# Patient Record
Sex: Male | Born: 1970 | Race: Black or African American | Hispanic: No | Marital: Single | Smoking: Never smoker
Health system: Southern US, Community
[De-identification: ages and names within clinical notes are randomized; demographics above are authoritative.]

## PROBLEM LIST (undated history)

## (undated) DIAGNOSIS — E119 Type 2 diabetes mellitus without complications: Secondary | ICD-10-CM

## (undated) DIAGNOSIS — S86011A Strain of right Achilles tendon, initial encounter: Secondary | ICD-10-CM

## (undated) HISTORY — PX: OTHER SURGICAL HISTORY: SHX169

## (undated) HISTORY — PX: NO PAST SURGERIES: SHX2092

---

## 2011-08-25 ENCOUNTER — Ambulatory Visit
Admission: RE | Admit: 2011-08-25 | Discharge: 2011-08-25 | Disposition: A | Discharge status: Home / Self Care | Payer: PRIVATE HEALTH INSURANCE | Source: Ambulatory Visit | Attending: Family Medicine | Admitting: Family Medicine

## 2011-08-25 ENCOUNTER — Other Ambulatory Visit: Payer: Self-pay | Admitting: Family Medicine

## 2011-08-25 ENCOUNTER — Ambulatory Visit: Payer: PRIVATE HEALTH INSURANCE

## 2011-08-25 ENCOUNTER — Ambulatory Visit (INDEPENDENT_AMBULATORY_CARE_PROVIDER_SITE_OTHER): Payer: PRIVATE HEALTH INSURANCE | Admitting: Family Medicine

## 2011-08-25 VITALS — BP 129/78 | HR 61 | Temp 98.5°F | Resp 16 | Ht 62.25 in | Wt 174.4 lb

## 2011-08-25 DIAGNOSIS — R1011 Right upper quadrant pain: Secondary | ICD-10-CM

## 2011-08-25 DIAGNOSIS — R079 Chest pain, unspecified: Secondary | ICD-10-CM

## 2011-08-25 LAB — POCT CBC
Granulocyte percent: 66.2 %G (ref 37–80)
MCV: 91.4 fL (ref 80–97)
MID (cbc): 0.8 (ref 0–0.9)
MPV: 8.3 fL (ref 0–99.8)
POC Granulocyte: 8.8 — AB (ref 2–6.9)
Platelet Count, POC: 277 10*3/uL (ref 142–424)
RBC: 4.96 M/uL (ref 4.69–6.13)
RDW, POC: 12.4 %

## 2011-08-25 LAB — POCT URINALYSIS DIPSTICK
Blood, UA: NEGATIVE
Nitrite, UA: NEGATIVE
Spec Grav, UA: 1.01
Urobilinogen, UA: 0.2
pH, UA: 5.5

## 2011-08-25 LAB — COMPREHENSIVE METABOLIC PANEL
ALT: 43 U/L (ref 0–53)
AST: 26 U/L (ref 0–37)
Albumin: 4.3 g/dL (ref 3.5–5.2)
Alkaline Phosphatase: 76 U/L (ref 39–117)
BUN: 13 mg/dL (ref 6–23)
Calcium: 9.6 mg/dL (ref 8.4–10.5)
Chloride: 100 mEq/L (ref 96–112)
Potassium: 4.2 mEq/L (ref 3.5–5.3)
Sodium: 138 mEq/L (ref 135–145)
Total Protein: 7.7 g/dL (ref 6.0–8.3)

## 2011-08-25 LAB — POCT UA - MICROSCOPIC ONLY
Casts, Ur, LPF, POC: NEGATIVE
Crystals, Ur, HPF, POC: NEGATIVE

## 2011-08-25 MED ORDER — CYCLOBENZAPRINE HCL 10 MG PO TABS: 10.0000 mg | ORAL_TABLET | Freq: Two times a day (BID) | ORAL | Status: AC | PRN

## 2011-08-25 NOTE — Progress Notes (Signed)
Patient Name: Derek Hopkins Date of Birth: [birth year] Medical Record Number: [id number] Gender: male Date of Encounter: 08/25/2011  History of Present Illness:  Derek Hopkins is a 41 y.o. very pleasant male patient who presents with the following:  Notes a discomfort/ "pressure" in his RUQ for the last 3 days.  He had an episode of similar symptoms which resolved- this occurred several years ago- otherwise has not had this before.   No nausea, no vomiting.  Has felt "a little weak" at times. No diarrhea or constipation- he has been having stools normally- had a BM today.    He has been eating and has not noted a change in his symptoms with eating.  No coughing- he does have AR symptoms but they are mild No SOB.  No known injury Lying down is the most comfortable position for him- sitting up is uncomfortable.    Otherwise he is generally a healthy person  There is no problem list on file for this patient.  No past medical history on file. No past surgical history on file. History  Substance Use Topics  . Smoking status: Never Smoker   . Smokeless tobacco: Not on file  . Alcohol Use: Not on file   No family history on file. No Known Allergies  Medication list has been reviewed and updated.  Review of Systems: As per HPI- otherwise negative.   Physical Examination: Filed Vitals:   08/25/11 1238  BP: 129/78  Pulse: 61  Temp: 98.5 F (36.9 C)  TempSrc: Oral  Resp: 16  Height: 5' 2.25" (1.581 m)  Weight: 174 lb 6.4 oz (79.107 kg)  SpO2: 97%    Body mass index is 31.64 kg/(m^2).  GEN: WDWN, NAD, Non-toxic, A & O x 3, overweight HEENT: Atraumatic, Normocephalic. Neck supple. No masses, No LAD.  Tm, oropharynx wnl Ears and Nose: No external deformity. CV: RRR, No M/G/R. No JVD. No thrill. No extra heart sounds. PULM: CTA B, no wheezes, crackles, rhonchi. No retractions. No resp. distress. No accessory muscle use. ABD: S, NT, ND, +BS. No rebound. No HSM. EXTR: No  c/c/e NEURO Normal gait.  PSYCH: Normally interactive. Conversant. Not depressed or anxious appearing.  Calm demeanor.  There is some mild discomfort to pressure on the lower right ribs. No rash, redness, bruise or lesion.  Negative murphy's  [facility] reading (PRIMARY) by  Dr. Patsy Lager.  Unremarkable chest and abdomen 1 view each Added lateral chest: also negative  Results for orders placed in visit on 08/25/11  POCT CBC      Component Value Range   WBC 13.3 (*) 4.6 - 10.2 (K/uL)   Lymph, poc 3.7 (*) 0.6 - 3.4    POC LYMPH PERCENT 27.5  10 - 50 (%L)   MID (cbc) 0.8  0 - 0.9    POC MID % 6.3  0 - 12 (%M)   POC Granulocyte 8.8 (*) 2 - 6.9    Granulocyte percent 66.2  37 - 80 (%G)   RBC 4.96  4.69 - 6.13 (M/uL)   Hemoglobin 15.2  14.1 - 18.1 (g/dL)   HCT, POC 40.9  81.1 - 53.7 (%)   MCV 91.4  80 - 97 (fL)   MCH, POC 30.6  27 - 31.2 (pg)   MCHC 33.6  31.8 - 35.4 (g/dL)   RDW, POC 91.4     Platelet Count, POC 277  142 - 424 (K/uL)   MPV 8.3  0 - 99.8 (fL)  POCT URINALYSIS DIPSTICK  Component Value Range   Color, UA yellow     Clarity, UA clear     Glucose, UA neg     Bilirubin, UA neg     Ketones, UA neg     Spec Grav, UA 1.010     Blood, UA neg     pH, UA 5.5     Protein, UA neg     Urobilinogen, UA 0.2     Nitrite, UA neg     Leukocytes, UA Negative    POCT UA - MICROSCOPIC ONLY      Component Value Range   WBC, Ur, HPF, POC 0-1     RBC, urine, microscopic 0-1     Bacteria, U Microscopic neg     Mucus, UA trace     Epithelial cells, urine per micros 0-1     Crystals, Ur, HPF, POC neg     Casts, Ur, LPF, POC neg     Yeast, UA neg     He last ate a fiber bar around 8:30am, and has been drinking water.  Will send for abdominal U/S to rule- out cholecystitis.   ABDOMEN - 1 VIEW  Comparison: Chest radiograph today.  Findings: Normal bowel gas pattern. Calcified phleboliths in the anatomic pelvis. Mild right hip osteoarthritis. No  bowel dilation.  IMPRESSION: Nonobstructive bowel gas pattern. Mild right hip osteoarthritis.  CHEST - 2 VIEW  Comparison: None.  Findings: Cardiopericardial silhouette within normal limits. Mediastinal contours normal. Trachea midline. No airspace disease or effusion. No free air is present under the hemidiaphragms. Slightly low volumes on the frontal.  IMPRESSION: Negative two-view chest.  Assessment and Plan: 1. RUQ pain  POCT CBC, Comprehensive metabolic panel, POCT urinalysis dipstick, POCT UA - Microscopic Only, DG Chest 1 View, DG Abd 1 View, US Abdomen Complete, cyclobenzaprine (FLEXERIL) 10 MG tablet   Once ultrasound results in called Jillyn Hidden- no gallbladder disease.  Suspect that he is having MSK pain.  Await CMP. Trial of flexeril as above.  If he is not feeling better plan to recheck in 1 or 2 days.  If he is getting worse please call, RTC or go to ED.  He agreed with the plan and felt comfortable with follow- up. We also need to recheck his CBC in a few days regardless- will put this order on chart so he can do a lab visit only IF he does not otherwise need to follow- up.   COMPLETE ABDOMINAL ULTRASOUND  Comparison: None  Findings:  Gallbladder: No gallstones, gallbladder wall thickening, or pericholecystic fluid.  Common bile duct: Normal in caliber measuring a maximum of 2.30mm.  Liver: The liver is sonographically unremarkable. There is normal echogenicity without focal lesions or intrahepatic biliary dilatation.  IVC: Normal caliber.  Pancreas: Limited visualization due to overlying bowel gas. The body region of the pancreas that is visualized appears normal.  Spleen: Normal size and echogenicity without focal lesions.  Right Kidney: 8.5 cm in length. Normal renal cortical thickness and echogenicity without focal lesions or hydronephrosis.  Left Kidney: 10.5 cm in length. Normal renal cortical thickness and echogenicity without focal lesions or  hydronephrosis.  Abdominal aorta: Normal caliber.  IMPRESSION: Unremarkable abdominal ultrasound examination. Limited examination of the pancreas.

## 2011-08-27 ENCOUNTER — Ambulatory Visit: Payer: PRIVATE HEALTH INSURANCE

## 2011-08-27 ENCOUNTER — Telehealth: Payer: Self-pay | Admitting: Radiology

## 2011-08-27 NOTE — Telephone Encounter (Signed)
Called and LMOM- I will see him tomorrow, call in the meantime if he needs anything

## 2011-08-27 NOTE — Telephone Encounter (Signed)
Patient did come in after you left today. He will return tomorrow to see you only. He was not seen today. He would like a phone call from you though about his labs and medication. I did tell him that you would probably want to see him anyway even if you called him.

## 2011-08-28 ENCOUNTER — Ambulatory Visit (INDEPENDENT_AMBULATORY_CARE_PROVIDER_SITE_OTHER): Payer: PRIVATE HEALTH INSURANCE | Admitting: Family Medicine

## 2011-08-28 VITALS — BP 123/82 | HR 59 | Temp 97.9°F | Resp 16 | Ht 62.25 in | Wt 174.0 lb

## 2011-08-28 DIAGNOSIS — R1011 Right upper quadrant pain: Secondary | ICD-10-CM

## 2011-08-28 DIAGNOSIS — R109 Unspecified abdominal pain: Secondary | ICD-10-CM

## 2011-08-28 DIAGNOSIS — D72829 Elevated white blood cell count, unspecified: Secondary | ICD-10-CM

## 2011-08-28 LAB — POCT CBC
Hemoglobin: 15 g/dL (ref 14.1–18.1)
MCH, POC: 30.2 pg (ref 27–31.2)
MCHC: 32.8 g/dL (ref 31.8–35.4)
MID (cbc): 0.7 (ref 0–0.9)
MPV: 8.2 fL (ref 0–99.8)
POC Granulocyte: 6.9 (ref 2–6.9)
POC MID %: 5.9 %M (ref 0–12)
Platelet Count, POC: 308 10*3/uL (ref 142–424)
RBC: 4.96 M/uL (ref 4.69–6.13)
WBC: 11.3 10*3/uL — AB (ref 4.6–10.2)

## 2011-08-28 NOTE — Progress Notes (Signed)
Patient Name: Derek Hopkins Date of Birth: 01/16/1971 Medical Record Number: [id number] Gender: male Date of Encounter: 08/28/2011  History of Present Illness:  Derek Hopkins is a 41 y.o. very pleasant male patient who presents with the following:  Here today to recheck his right UQ/ right lower rib discomfort- see last OV.  He had a negative abdominal ultrasound and CMP- but he did have a leukocytosis.    He does "not feel 100%" but does note improvement. He will occasionally feel a pain but changing position usually resolves it. Breathing ok- no SOB, no cough, no fever.  He did sweat a lot over the weekend.  Eating ok- no nausea or vomiting.    There is no problem list on file for this patient.  No past medical history on file. No past surgical history on file. History  Substance Use Topics  . Smoking status: Never Smoker   . Smokeless tobacco: Not on file  . Alcohol Use: Not on file   No family history on file. No Known Allergies  Medication list has been reviewed and updated.  Review of Systems: As per HPI- otherwise negative.   Physical Examination: Filed Vitals:   08/28/11 1515  BP: 123/82  Pulse: 59  Temp: 97.9 F (36.6 C)  TempSrc: Oral  Resp: 16  Height: 5' 2.25" (1.581 m)  Weight: 174 lb (78.926 kg)    Body mass index is 31.57 kg/(m^2).  GEN: WDWN, NAD, Non-toxic, A & O x 3, overweight HEENT: Atraumatic, Normocephalic. Neck supple. No masses, No LAD. Ears and Nose: No external deformity. CV: RRR, No M/G/R. No JVD. No thrill. No extra heart sounds. PULM: CTA B, no wheezes, crackles, rhonchi. No retractions. No resp. distress. No accessory muscle use. ABD: S, NT, ND, +BS. No rebound. No HSM.  He no longer complains of tenderness to exam in his RUQ/ lower right rib border.  Negative murphys  EXTR: No c/c/e NEURO Normal gait.  PSYCH: Normally interactive. Conversant. Not depressed or anxious appearing.  Calm demeanor.   Results for orders placed in visit on  08/28/11  POCT CBC      Component Value Range   WBC 11.3 (*) 4.6 - 10.2 (K/uL)   Lymph, poc 3.7 (*) 0.6 - 3.4    POC LYMPH PERCENT 33.1  10 - 50 (%L)   MID (cbc) 0.7  0 - 0.9    POC MID % 5.9  0 - 12 (%M)   POC Granulocyte 6.9  2 - 6.9    Granulocyte percent 61.0  37 - 80 (%G)   RBC 4.96  4.69 - 6.13 (M/uL)   Hemoglobin 15.0  14.1 - 18.1 (g/dL)   HCT, POC 78.2  95.6 - 53.7 (%)   MCV 92.3  80 - 97 (fL)   MCH, POC 30.2  27 - 31.2 (pg)   MCHC 32.8  31.8 - 35.4 (g/dL)   RDW, POC 21.3     Platelet Count, POC 308  142 - 424 (K/uL)   MPV 8.2  0 - 99.8 (fL)     Assessment and Plan: 1. Abdominal  pain, other specified site    2. Leukocytosis  POCT CBC   Leukocytosis is improving, as are his symptoms.  Fairly extensive w/u including xrays, abdominal U/S and CMP all negative. As long as he continues to improve to 100% well he does not need to have a recheck- however I did ask him to have a lab visit only in about 3 weeks to  ensure his WBC count is back to normal- he has an order for this.    If his symptoms get worse or do not continue to improve he will RTC.

## 2011-12-16 ENCOUNTER — Ambulatory Visit (INDEPENDENT_AMBULATORY_CARE_PROVIDER_SITE_OTHER): Payer: PRIVATE HEALTH INSURANCE | Admitting: Emergency Medicine

## 2011-12-16 VITALS — BP 128/75 | HR 54 | Temp 98.5°F | Resp 16 | Ht 62.25 in | Wt 179.0 lb

## 2011-12-16 DIAGNOSIS — H6120 Impacted cerumen, unspecified ear: Secondary | ICD-10-CM

## 2011-12-16 DIAGNOSIS — H6690 Otitis media, unspecified, unspecified ear: Secondary | ICD-10-CM

## 2011-12-16 DIAGNOSIS — H66009 Acute suppurative otitis media without spontaneous rupture of ear drum, unspecified ear: Secondary | ICD-10-CM

## 2011-12-16 MED ORDER — FLUTICASONE PROPIONATE 50 MCG/ACT NA SUSP: 2.0000 | Freq: Every day | NASAL | Status: DC

## 2011-12-16 MED ORDER — AMOXICILLIN 875 MG PO TABS: 875.0000 mg | ORAL_TABLET | Freq: Two times a day (BID) | ORAL | Status: AC

## 2011-12-16 NOTE — Progress Notes (Signed)
  Subjective:    Patient ID: Derek Hopkins, male    DOB: [birth year], 41 y.o.   MRN: [id number]  HPI and a history of head congestion. Possibility here of his left year ago he has not had much pain.    Review of Systems     Objective:   Physical Exam the left external auditory canal is filled with wax. The right TM is normal the nose is slightly congested. Throat is clear neck supple chest is clear to both auscultation and percussion.        Assessment & Plan:

## 2012-08-05 ENCOUNTER — Ambulatory Visit: Payer: PRIVATE HEALTH INSURANCE

## 2012-08-05 ENCOUNTER — Ambulatory Visit (INDEPENDENT_AMBULATORY_CARE_PROVIDER_SITE_OTHER): Payer: PRIVATE HEALTH INSURANCE | Admitting: Family Medicine

## 2012-08-05 VITALS — BP 140/82 | HR 70 | Temp 98.0°F | Resp 16 | Ht 62.5 in | Wt 178.0 lb

## 2012-08-05 DIAGNOSIS — R1011 Right upper quadrant pain: Secondary | ICD-10-CM

## 2012-08-05 DIAGNOSIS — K5900 Constipation, unspecified: Secondary | ICD-10-CM

## 2012-08-05 DIAGNOSIS — R1084 Generalized abdominal pain: Secondary | ICD-10-CM

## 2012-08-05 LAB — POCT CBC
Granulocyte percent: 61.3 %G (ref 37–80)
HCT, POC: 45.9 % (ref 43.5–53.7)
Hemoglobin: 15 g/dL (ref 14.1–18.1)
Lymph, poc: 3.2 (ref 0.6–3.4)
MCH, POC: 30.7 pg (ref 27–31.2)
MCHC: 32.7 g/dL (ref 31.8–35.4)
MCV: 93.9 fL (ref 80–97)
MID (cbc): 1 — AB (ref 0–0.9)
MPV: 8.7 fL (ref 0–99.8)
POC Granulocyte: 6.6 (ref 2–6.9)
POC LYMPH PERCENT: 29.5 %L (ref 10–50)
POC MID %: 9.2 % (ref 0–12)
Platelet Count, POC: 262 10*3/uL (ref 142–424)
RBC: 4.89 M/uL (ref 4.69–6.13)
RDW, POC: 13 %
WBC: 10.7 10*3/uL — AB (ref 4.6–10.2)

## 2012-08-05 LAB — COMPREHENSIVE METABOLIC PANEL WITH GFR
ALT: 25 U/L (ref 0–53)
AST: 16 U/L (ref 0–37)
Alkaline Phosphatase: 82 U/L (ref 39–117)
CO2: 25 meq/L (ref 19–32)
Sodium: 138 meq/L (ref 135–145)
Total Bilirubin: 0.4 mg/dL (ref 0.3–1.2)
Total Protein: 8 g/dL (ref 6.0–8.3)

## 2012-08-05 LAB — POCT URINALYSIS DIPSTICK
Bilirubin, UA: NEGATIVE
Blood, UA: NEGATIVE
Glucose, UA: NEGATIVE
Ketones, UA: NEGATIVE
Leukocytes, UA: NEGATIVE
Nitrite, UA: NEGATIVE
Protein, UA: NEGATIVE
Spec Grav, UA: 1.025
Urobilinogen, UA: 0.2
pH, UA: 5

## 2012-08-05 LAB — COMPREHENSIVE METABOLIC PANEL
Albumin: 4.3 g/dL (ref 3.5–5.2)
BUN: 13 mg/dL (ref 6–23)
Calcium: 9.8 mg/dL (ref 8.4–10.5)
Chloride: 103 mEq/L (ref 96–112)
Creat: 1.22 mg/dL (ref 0.50–1.35)
Glucose, Bld: 76 mg/dL (ref 70–99)
Potassium: 4.1 mEq/L (ref 3.5–5.3)

## 2012-08-05 LAB — POCT UA - MICROSCOPIC ONLY
Bacteria, U Microscopic: NEGATIVE
Casts, Ur, LPF, POC: NEGATIVE
Crystals, Ur, HPF, POC: NEGATIVE
Epithelial cells, urine per micros: NEGATIVE
Mucus, UA: NEGATIVE
Yeast, UA: NEGATIVE

## 2012-08-05 LAB — AMYLASE: Amylase: 80 U/L (ref 0–105)

## 2012-08-05 LAB — LIPASE: Lipase: 16 U/L (ref 0–75)

## 2012-08-05 NOTE — Patient Instructions (Signed)
High-Fiber Diet Fiber is found in fruits, vegetables, and grains. A high-fiber diet encourages the addition of more whole grains, legumes, fruits, and vegetables in your diet. The recommended amount of fiber for adult males is 38 g per day. For adult females, it is 25 g per day. Pregnant and lactating women should get 28 g of fiber per day. If you have a digestive or bowel problem, ask your caregiver for advice before adding high-fiber foods to your diet. Eat a variety of high-fiber foods instead of only a select few type of foods.  PURPOSE  To increase stool bulk.  To make bowel movements more regular to prevent constipation.  To lower cholesterol.  To prevent overeating. WHEN IS THIS DIET USED?  It may be used if you have constipation and hemorrhoids.  It may be used if you have uncomplicated diverticulosis (intestine condition) and irritable bowel syndrome.  It may be used if you need help with weight management.  It may be used if you want to add it to your diet as a protective measure against atherosclerosis, diabetes, and cancer. SOURCES OF FIBER  Whole-grain breads and cereals.  Fruits, such as apples, oranges, bananas, berries, prunes, and pears.  Vegetables, such as green peas, carrots, sweet potatoes, beets, broccoli, cabbage, spinach, and artichokes.  Legumes, such split peas, soy, lentils.  Almonds. FIBER CONTENT IN FOODS Starches and Grains / Dietary Fiber (g)  Cheerios, 1 cup / 3 g  Corn Flakes cereal, 1 cup / 0.7 g  Rice crispy treat cereal, 1 cup / 0.3 g  Instant oatmeal (cooked),  cup / 2 g  Frosted wheat cereal, 1 cup / 5.1 g  Brown, long-grain rice (cooked), 1 cup / 3.5 g  White, long-grain rice (cooked), 1 cup / 0.6 g  Enriched macaroni (cooked), 1 cup / 2.5 g Legumes / Dietary Fiber (g)  Baked beans (canned, plain, or vegetarian),  cup / 5.2 g  Kidney beans (canned),  cup / 6.8 g  Pinto beans (cooked),  cup / 5.5 g Breads and Crackers  / Dietary Fiber (g)  Plain or honey graham crackers, 2 squares / 0.7 g  Saltine crackers, 3 squares / 0.3 g  Plain, salted pretzels, 10 pieces / 1.8 g  Whole-wheat bread, 1 slice / 1.9 g  White bread, 1 slice / 0.7 g  Raisin bread, 1 slice / 1.2 g  Plain bagel, 3 oz / 2 g  Flour tortilla, 1 oz / 0.9 g  Corn tortilla, 1 small / 1.5 g  Hamburger or hotdog bun, 1 small / 0.9 g Fruits / Dietary Fiber (g)  Apple with skin, 1 medium / 4.4 g  Sweetened applesauce,  cup / 1.5 g  Banana,  medium / 1.5 g  Grapes, 10 grapes / 0.4 g  Orange, 1 small / 2.3 g  Raisin, 1.5 oz / 1.6 g  Melon, 1 cup / 1.4 g Vegetables / Dietary Fiber (g)  Green beans (canned),  cup / 1.3 g  Carrots (cooked),  cup / 2.3 g  Broccoli (cooked),  cup / 2.8 g  Peas (cooked),  cup / 4.4 g  Mashed potatoes,  cup / 1.6 g  Lettuce, 1 cup / 0.5 g  Corn (canned),  cup / 1.6 g  Tomato,  cup / 1.1 g Document Released: 03/27/2005 Document Revised: 09/26/2011 Document Reviewed: 06/29/2011 [facility] Patient Information 2013 [place], [place]. Constipation, Adult Constipation is when a person has fewer than 3 bowel movements a week; has  difficulty having a bowel movement; or has stools that are dry, hard, or larger than normal. As people grow older, constipation is more common. If you try to fix constipation with medicines that make you have a bowel movement (laxatives), the problem may get worse. Long-term laxative use may cause the muscles of the colon to become weak. A low-fiber diet, not taking in enough fluids, and taking certain medicines may make constipation worse. CAUSES   Certain medicines, such as antidepressants, pain medicine, iron supplements, antacids, and water pills.   Certain diseases, such as diabetes, irritable bowel syndrome (IBS), thyroid disease, or depression.   Not drinking enough water.   Not eating enough fiber-rich foods.   Stress or travel.  Lack of physical  activity or exercise.  Not going to the restroom when there is the urge to have a bowel movement.  Ignoring the urge to have a bowel movement.  Using laxatives too much. SYMPTOMS   Having fewer than 3 bowel movements a week.   Straining to have a bowel movement.   Having hard, dry, or larger than normal stools.   Feeling full or bloated.   Pain in the lower abdomen.  Not feeling relief after having a bowel movement. DIAGNOSIS  Your caregiver will take a medical history and perform a physical exam. Further testing may be done for severe constipation. Some tests may include:   A barium enema X-ray to examine your rectum, colon, and sometimes, your small intestine.  A sigmoidoscopy to examine your lower colon.  A colonoscopy to examine your entire colon. TREATMENT  Treatment will depend on the severity of your constipation and what is causing it. Some dietary treatments include drinking more fluids and eating more fiber-rich foods. Lifestyle treatments may include regular exercise. If these diet and lifestyle recommendations do not help, your caregiver may recommend taking over-the-counter laxative medicines to help you have bowel movements. Prescription medicines may be prescribed if over-the-counter medicines do not work.  HOME CARE INSTRUCTIONS   Increase dietary fiber in your diet, such as fruits, vegetables, whole grains, and beans. Limit high-fat and processed sugars in your diet, such as [place] fries, hamburgers, cookies, candies, and soda.   A fiber supplement may be added to your diet if you cannot get enough fiber from foods.   Drink enough fluids to keep your urine clear or pale yellow.   Exercise regularly or as directed by your caregiver.   Go to the restroom when you have the urge to go. Do not hold it.  Only take medicines as directed by your caregiver. Do not take other medicines for constipation without talking to your caregiver first. SEEK IMMEDIATE  MEDICAL CARE IF:   You have bright red blood in your stool.   Your constipation lasts for more than 4 days or gets worse.   You have abdominal or rectal pain.   You have thin, pencil-like stools.  You have unexplained weight loss. MAKE SURE YOU:   Understand these instructions.  Will watch your condition.  Will get help right away if you are not doing well or get worse. Document Released: 12/24/2003 Document Revised: 06/19/2011 Document Reviewed: 02/28/2011 [facility] Patient Information 2013 [place], [place].

## 2012-08-05 NOTE — Progress Notes (Signed)
[facility]:  Office Visit  Chief Complaint:  Chief Complaint  Patient presents with  . Abdominal Pain    RUQ    HPI: Derek Hopkins is a 42 y.o. male who complains of:  1. RUQ abd pressure, does not want to call it pain. "pressure--feels like ribs is pressing down on him, x 2 weeks." standing up makes him feel better. Unable to figure out if related to food. Worse with sitting. No abd surgeries. + Flatus. Last BM yesterday. Regular BM. No melena, no hematuria. A little bit of straining. Feels better when he urinates and goes to have BM. No odor, dysuria, urethral dc, color changes to urine. Still has gallbladder. Does not know if he has high cholesterol. Last [place] abd was in 08/2011. Pain feels different, since intermittently travels along right flank, sometimes has sharp pain radiating to shoulder. He was given flexeril a year ago and it helped minimally. Random initiation. Denies fevers, weightloss, chills, SOB. Deneis smoking, HTN, Deneis Nausea, vomiting.   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 Laying down does not make it better. Denies of having any back problems. No prior back injuries. Primarily monitoring, suprevisoring; no major lifting.    History reviewed. No pertinent past medical history. History reviewed. No pertinent past surgical history. History   Social History  . Marital Status: Single    Spouse Name: N/A    Number of Children: N/A  . Years of Education: N/A   Social History Main Topics  . Smoking status: Former Games developer  . Smokeless tobacco: None  . Alcohol Use: Yes  . Drug Use: No  . Sexually Active: None   Other Topics Concern  . None   Social History Narrative  . None   Family History  Problem Relation Age of Onset  . Cancer Mother   . Cancer Father    No Known Allergies Prior to Admission medications   Medication Sig Start Date End Date Taking? Authorizing Provider  fluticasone  (FLONASE) 50 MCG/ACT nasal spray Place 2 sprays into the nose daily. 12/16/11 12/15/12  Collene Gobble, MD     ROS: The patient denies fevers, chills, night sweats, unintentional weight loss, chest pain, palpitations, wheezing, dyspnea on exertion, nausea, vomiting, dysuria, hematuria, melena, numbness, weakness, or tingling.   All other systems have been reviewed and were otherwise negative with the exception of those mentioned in the HPI and as above.    PHYSICAL EXAM: Filed Vitals:   08/05/12 1249  BP: 140/82  Pulse: 70  Temp: 98 F (36.7 C)  Resp: 16  Spo2    99% Filed Vitals:   08/05/12 1249  Height: 5' 2.5" (1.588 m)  Weight: 178 lb (80.74 kg)   Body mass index is 32.02 kg/(m^2).  General: Alert, no acute distress HEENT:  Normocephalic, atraumatic, oropharynx patent.  Cardiovascular:  Regular rate and rhythm, no rubs murmurs or gallops.  No Carotid bruits, radial pulse intact. No pedal edema.  Respiratory: Clear to auscultation bilaterally.  No wheezes, rales, or rhonchi.  No cyanosis, no use of accessory musculature GI: No organomegaly, abdomen is soft and non-tender, positive bowel sounds.  No masses. Skin: No rashes. Neurologic: Facial musculature symmetric. Psychiatric: Patient is appropriate throughout our interaction. Lymphatic: No cervical lymphadenopathy Musculoskeletal: Gait intact.   LABS: Results for orders placed in visit on 08/05/12  GC/CHLAMYDIA PROBE AMP      Result Value Range  CT Probe RNA NEGATIVE     GC Probe RNA NEGATIVE    COMPREHENSIVE METABOLIC PANEL      Result Value Range   Sodium 138  135 - 145 mEq/L   Potassium 4.1  3.5 - 5.3 mEq/L   Chloride 103  96 - 112 mEq/L   CO2 25  19 - 32 mEq/L   Glucose, Bld 76  70 - 99 mg/dL   BUN 13  6 - 23 mg/dL   Creat 8.46  9.62 - 9.52 mg/dL   Total Bilirubin 0.4  0.3 - 1.2 mg/dL   Alkaline Phosphatase 82  39 - 117 U/L   AST 16  0 - 37 U/L   ALT 25  0 - 53 U/L   Total Protein 8.0  6.0 - 8.3 g/dL    Albumin 4.3  3.5 - 5.2 g/dL   Calcium 9.8  8.4 - 84.1 mg/dL  AMYLASE      Result Value Range   Amylase 80  0 - 105 U/L  LIPASE      Result Value Range   Lipase 16  0 - 75 U/L  POCT CBC      Result Value Range   WBC 10.7 (*) 4.6 - 10.2 K/uL   Lymph, poc 3.2  0.6 - 3.4   POC LYMPH PERCENT 29.5  10 - 50 %L   MID (cbc) 1.0 (*) 0 - 0.9   POC MID % 9.2  0 - 12 %M   POC Granulocyte 6.6  2 - 6.9   Granulocyte percent 61.3  37 - 80 %G   RBC 4.89  4.69 - 6.13 M/uL   Hemoglobin 15.0  14.1 - 18.1 g/dL   HCT, POC 32.4  40.1 - 53.7 %   MCV 93.9  80 - 97 fL   MCH, POC 30.7  27 - 31.2 pg   MCHC 32.7  31.8 - 35.4 g/dL   RDW, POC 02.7     Platelet Count, POC 262  142 - 424 K/uL   MPV 8.7  0 - 99.8 fL  POCT UA - MICROSCOPIC ONLY      Result Value Range   WBC, Ur, HPF, POC 0-1     RBC, urine, microscopic 0-1     Bacteria, U Microscopic neg     Mucus, UA neg     Epithelial cells, urine per micros neg     Crystals, Ur, HPF, POC neg     Casts, Ur, LPF, POC neg     Yeast, UA neg    POCT URINALYSIS DIPSTICK      Result Value Range   Color, UA yellow     Clarity, UA clear     Glucose, UA neg     Bilirubin, UA neg     Ketones, UA neg     Spec Grav, UA 1.025     Blood, UA neg     pH, UA 5.0     Protein, UA neg     Urobilinogen, UA 0.2     Nitrite, UA neg     Leukocytes, UA Negative       EKG/XRAY:   Primary read interpreted by Dr. Conley Rolls at [facility] Chest-no infiltrate, no pneumothorax, no effsuion Abd-+ stool, no free air, no masses   ASSESSMENT/PLAN: Encounter Diagnoses  Name Primary?  . RUQ abdominal pain Yes  . Unspecified constipation    Rx Miralax  F/u in 1 week prn or go to ER prn  Derek Hopkins  PHUONG, DO 08/07/2012 11:18 AM   4/30 @ 11:22 --LM for patient regarding normal CMP, amylase, lipase and GC uri probe. F/u prn

## 2012-08-06 LAB — GC/CHLAMYDIA PROBE AMP
CT Probe RNA: NEGATIVE
GC Probe RNA: NEGATIVE

## 2013-04-09 ENCOUNTER — Ambulatory Visit (INDEPENDENT_AMBULATORY_CARE_PROVIDER_SITE_OTHER): Payer: PRIVATE HEALTH INSURANCE | Admitting: Internal Medicine

## 2013-04-09 ENCOUNTER — Ambulatory Visit: Payer: PRIVATE HEALTH INSURANCE

## 2013-04-09 VITALS — BP 122/80 | HR 85 | Temp 98.5°F | Resp 18 | Ht 63.0 in | Wt 181.0 lb

## 2013-04-09 DIAGNOSIS — IMO0002 Reserved for concepts with insufficient information to code with codable children: Secondary | ICD-10-CM

## 2013-04-09 DIAGNOSIS — S8392XA Sprain of unspecified site of left knee, initial encounter: Secondary | ICD-10-CM

## 2013-04-09 DIAGNOSIS — M25562 Pain in left knee: Secondary | ICD-10-CM

## 2013-04-09 DIAGNOSIS — M25569 Pain in unspecified knee: Secondary | ICD-10-CM

## 2013-04-09 NOTE — Progress Notes (Signed)
   Subjective:    Patient ID: Derek Hopkins, male    DOB: 12/25/1970, 42 y.o.   MRN: [id number]  HPI At home playing basket ball,fell full weight left knee and may have twisted, now pain medial joint line and painful walking. No swelling or warmth, no previous left knee injury, walks with limp.   Review of Systems healthy    Objective:   Physical Exam  Vitals reviewed. Constitutional: He is oriented to person, place, and time. He appears well-developed and well-nourished.  Eyes: EOM are normal.  Neck: Neck supple.  Cardiovascular: Normal rate.   Pulmonary/Chest: Effort normal.  Musculoskeletal: He exhibits tenderness.       Left knee: He exhibits bony tenderness, abnormal meniscus and MCL laxity. He exhibits normal range of motion, no swelling, no effusion, no ecchymosis, no deformity, no laceration, no erythema, normal alignment and normal patellar mobility. Tenderness found. Lateral joint line and MCL tenderness noted. No patellar tendon tenderness noted.  Neurological: He is alert and oriented to person, place, and time. No cranial nerve deficit. He exhibits normal muscle tone. Coordination abnormal.  Skin: No rash noted.  Psychiatric: He has a normal mood and affect. His behavior is normal.    [facility] reading (PRIMARY) by  Dr.Rollen Selders.normal knee xr except old osgoods.        Assessment & Plan:  Sprain knee left RICE/Crutches/Hinged knee brace Minimal weight bearing RTC next Tuesday before 2 pm

## 2013-04-09 NOTE — Patient Instructions (Signed)
Meniscus Tear with Phase I Rehab The meniscus is a C-shaped cartilage structure, located in the knee joint between the thigh bone (femur) and the shinbone (tibia). Two menisci are located in each knee joint: the inner and outer meniscus. The meniscus acts as an adapter between the thigh bone and shinbone, allowing them to fit properly together. It also functions as a shock absorber, to reduce the stress placed on the knee joint and to help supply nutrients to the knee joint cartilage. As people age, the meniscus begins to harden and become more vulnerable to injury. Meniscus tears are a common injury, especially in older athletes. Inner meniscus tears are more common than outer meniscus tears.  SYMPTOMS   Pain in the knee, especially with standing or squatting with the affected leg.  Tenderness along the joint line.  Swelling in the knee joint (effusion), usually starting 1 to 2 days after injury.  Locking or catching of the knee joint, causing inability to straighten the knee completely.  Giving way or buckling of the knee. CAUSES  A meniscus tear occurs when a force is placed on the meniscus that is greater than it can handle. Common causes of injury include:  Direct hit (trauma) to the knee.  Twisting, pivoting, or cutting (rapidly changing direction while running), kneeling or squatting.  Without injury, due to aging. RISK INCREASES WITH:  Contact sports (football, rugby).  Sports in which cleats are used with pivoting (soccer, lacrosse) or sports in which good shoe grip and sudden change in direction are required (racquetball, basketball, squash).  Previous knee injury.  Associated knee injury, particularly ligament injuries.  Poor strength and flexibility. PREVENTION  Warm up and stretch properly before activity.  Maintain physical fitness:  Strength, flexibility, and endurance.  Cardiovascular fitness.  Protect the knee with a brace or elastic bandage.  Wear  properly fitted protective equipment (proper cleats for the surface). PROGNOSIS  Sometimes, meniscus tears heal on their own. However, definitive treatment requires surgery, followed by at least 6 weeks of recovery.  RELATED COMPLICATIONS   Recurring symptoms that result in a chronic problem.  Repeated knee injury, especially if sports are resumed too soon after injury or surgery.  Progression of the tear (the tear gets larger), if untreated.  Arthritis of the knee in later years (with or without surgery).  Complications of surgery, including infection, bleeding, injury to nerves (numbness, weakness, paralysis) continued pain, giving way, locking, nonhealing of meniscus (if repaired), need for further surgery, and knee stiffness (loss of motion). TREATMENT  Treatment first involves the use of ice and medicine, to reduce pain and inflammation. You may find using crutches to walk more comfortable. However, it is okay to bear weight on the injured knee, if the pain will allow it. Surgery is often advised as a definitive treatment. Surgery is performed through an incision near the joint (arthroscopically). The torn piece of the meniscus is removed, and if possible the joint cartilage is repaired. After surgery, the joint must be restrained. After restraint, it is important to perform strengthening and stretching exercises to help regain strength and a full range of motion. These exercises may be completed at home or with a therapist.  MEDICATION  If pain medicine is needed, nonsteroidal anti-inflammatory medicines (aspirin and ibuprofen), or other minor pain relievers (acetaminophen), are often advised.  Do not take pain medicine for 7 days before surgery.  Prescription pain relievers may be given, if your caregiver thinks they are needed. Use only as directed and   only as much as you need. HEAT AND COLD  Cold treatment (icing) should be applied for 10 to 15 minutes every 2 to 3 hours for  inflammation and pain, and immediately after activity that aggravates your symptoms. Use ice packs or an ice massage.  Heat treatment may be used before performing stretching and strengthening activities prescribed by your caregiver, physical therapist, or athletic trainer. Use a heat pack or a warm water soak. SEEK MEDICAL CARE IF:   Symptoms get worse or do not improve in 2 weeks, despite treatment.  New, unexplained symptoms develop. (Drugs used in treatment may produce side effects.) EXERCISES RANGE OF MOTION (ROM) AND STRETCHING EXERCISES - Meniscus Tear, Non-operative, Phase I These are some of the initial exercises with which you may start your rehabilitation program, until you see your caregiver again or until your symptoms are resolved. Remember:   These initial exercises are intended to be gentle. They will help you restore motion without increasing any swelling.  Completing these exercises allows less painful movement and prepares you for the more aggressive strengthening exercises in Phase II.  An effective stretch should be held for at least 30 seconds.  A stretch should never be painful. You should only feel a gentle lengthening or release in the stretched tissue. RANGE OF MOTION - Knee Flexion, Active  Lie on your back with both knees straight. (If this causes back discomfort, bend your healthy knee, placing your foot flat on the floor.)  Slowly slide your heel back toward your buttocks until you feel a gentle stretch in the front of your knee or thigh.  Hold for __________ seconds. Slowly slide your heel back to the starting position. Repeat __________ times. Complete this exercise __________ times per day.  RANGE OF MOTION - Knee Flexion and Extension, Active-Assisted  Sit on the edge of a table or chair with your thighs firmly supported. It may be helpful to place a folded towel under the end of your right / left thigh.  Flexion (bending): Place the ankle of your  healthy leg on top of the other ankle. Use your healthy leg to gently bend your right / left knee until you feel a mild tension across the top of your knee.  Hold for __________ seconds.  Extension (straightening): Switch your ankles so your right / left leg is on top. Use your healthy leg to straighten your right / left knee until you feel a mild tension on the backside of your knee.  Hold for __________ seconds. Repeat __________ times. Complete __________ times per day. STRETCH - Knee Flexion, Supine  Lie on the floor with your right / left heel and foot lightly touching the wall. (Place both feet on the wall if you do not use a door frame.)  Without using any effort, allow gravity to slide your foot down the wall slowly until you feel a gentle stretch in the front of your right / left knee.  Hold this stretch for __________ seconds. Then return the leg to the starting position, using your healthy leg for help, if needed. Repeat __________ times. Complete this stretch __________ times per day.  STRETCH - Knee Extension Sitting  Sit with your right / left leg/heel propped on another chair, coffee table, or foot stool.  Allow your leg muscles to relax, letting gravity straighten out your knee.*  You should feel a stretch behind your right / left knee. Hold this position for __________ seconds. Repeat __________ times. Complete this stretch __________   times per day.  *Your physician, physical therapist or athletic trainer may instruct you place a __________ weight on your thigh, just above your kneecap, to deepen the stretch.  STRENGTHENING EXERCISES - Meniscus Tear, Non-operative, Phase I These exercises may help you when beginning to rehabilitate your injury. They may resolve your symptoms with or without further involvement from your physician, physical therapist or athletic trainer. While completing these exercises, remember:   Muscles can gain both the endurance and the strength  needed for everyday activities through controlled exercises.  Complete these exercises as instructed by your physician, physical therapist or athletic trainer. Progress the resistance and repetitions only as guided. STRENGTH - Quadriceps, Isometrics  Lie on your back with your right / left leg extended and your opposite knee bent.  Gradually tense the muscles in the front of your right / left thigh. You should see either your knee cap slide up toward your hip or increased dimpling just above the knee. This motion will push the back of the knee down toward the floor, mat, or bed on which you are lying.  Hold the muscle as tight as you can, without increasing your pain, for __________ seconds.  Relax the muscles slowly and completely between each repetition. Repeat __________ times. Complete this exercise __________ times per day.  STRENGTH - Quadriceps, Short Arcs   Lie on your back. Place a __________ inch towel roll under your right / left knee, so that the knee bends slightly.  Raise only your lower leg by tightening the muscles in the front of your thigh. Do not allow your thigh to rise.  Hold this position for __________ seconds. Repeat __________ times. Complete this exercise __________ times per day.  OPTIONAL ANKLE WEIGHTS: Begin with ____________________, but DO NOT exceed ____________________. Increase in 1 pound/0.5 kilogram increments. STRENGTH - Quadriceps, Straight Leg Raises  Quality counts! Watch for signs that the quadriceps muscle is working, to be sure you are strengthening the correct muscles and not "cheating" by substituting with healthier muscles.  Lay on your back with your right / left leg extended and your opposite knee bent.  Tense the muscles in the front of your right / left thigh. You should see either your knee cap slide up or increased dimpling just above the knee. Your thigh may even shake a bit.  Tighten these muscles even more and raise your leg 4 to 6  inches off the floor. Hold for __________ seconds.  Keeping these muscles tense, lower your leg.  Relax the muscles slowly and completely in between each repetition. Repeat __________ times. Complete this exercise __________ times per day.  STRENGTH - Hamstring, Curls   Lay on your stomach with your legs extended. (If you lay on a bed, your feet may hang over the edge.)  Tighten the muscles in the back of your thigh to bend your right / left knee up to 90 degrees. Keep your hips flat on the bed.  Hold this position for __________ seconds.  Slowly lower your leg back to the starting position. Repeat __________ times. Complete this exercise __________ times per day.  STRENGTH  Quadriceps, Squats  Stand in a door frame so that your feet and knees are in line with the frame.  Use your hands for balance, not support, on the frame.  Slowly lower your weight, bending at the hips and knees. Keep your lower legs upright so that they are parallel with the door frame. Squat only within the range that does   not increase your knee pain. Never let your hips drop below your knees.  Slowly return upright, pushing with your legs, not pulling with your hands. Repeat __________ times. Complete this exercise __________ times per day.  STRENGTH - Quad/VMO, Isometric   Sit in a chair with your right / left knee slightly bent. With your fingertips, feel the VMO muscle just above the inside of your knee. The VMO is important in controlling the position of your kneecap.  Keeping your fingertips on this muscle. Without actually moving your leg, attempt to drive your knee down as if straightening your leg. You should feel your VMO tense. If you have a difficult time, you may wish to try the same exercise on your healthy knee first.  Tense this muscle as hard as you can without increasing any knee pain.  Hold for __________ seconds. Relax the muscles slowly and completely in between each repetition. Repeat  __________ times. Complete exercise __________ times per day.  Document Released: 04/10/1998 Document Revised: 06/19/2011 Document Reviewed: 07/09/2008 [employer] Patient Information 2014 [employer].    

## 2013-04-15 ENCOUNTER — Ambulatory Visit (INDEPENDENT_AMBULATORY_CARE_PROVIDER_SITE_OTHER): Payer: PRIVATE HEALTH INSURANCE | Admitting: Internal Medicine

## 2013-04-15 VITALS — BP 126/78 | HR 59 | Temp 98.5°F | Resp 17 | Ht 62.5 in | Wt 186.0 lb

## 2013-04-15 DIAGNOSIS — IMO0002 Reserved for concepts with insufficient information to code with codable children: Secondary | ICD-10-CM

## 2013-04-15 DIAGNOSIS — M25569 Pain in unspecified knee: Secondary | ICD-10-CM

## 2013-04-15 DIAGNOSIS — M25562 Pain in left knee: Secondary | ICD-10-CM

## 2013-04-15 DIAGNOSIS — S8392XS Sprain of unspecified site of left knee, sequela: Secondary | ICD-10-CM

## 2013-04-15 NOTE — Progress Notes (Signed)
   Subjective:    Patient ID: Derek Hopkins, Derek Hopkins    DOB: May 14, 1970, 43 y.o.   MRN: [id number]  HPI    Review of Systems     Objective:   Physical Exam        Assessment & Plan:

## 2013-04-15 NOTE — Progress Notes (Signed)
   Subjective:    Patient ID: Derek Hopkins, male    DOB: 1971-01-06, 43 y.o.   MRN: [id number]  HPI  43 y.o. Male presents to clinic for follow up of left knee pain. States that symptoms are getting better. Still having some inner knee pain. Has still been taking it slow when getting up out of chairs and has stiffness in the morning. Rates his pain at a 5-6 out of ten with certain movements.  Using brace, feels knee is weaker. Review of Systems     Objective:   Physical Exam  Constitutional: He is oriented to person, place, and time. He appears well-developed and well-nourished.  HENT:  Head: Normocephalic.  Eyes: EOM are normal.  Pulmonary/Chest: Effort normal.  Musculoskeletal: Normal range of motion. He exhibits tenderness.       Left knee: He exhibits bony tenderness and abnormal meniscus. He exhibits normal range of motion, no swelling, no effusion, no ecchymosis, no deformity, no laceration, no erythema, normal alignment, no LCL laxity, normal patellar mobility and no MCL laxity. Tenderness found. Medial joint line tenderness noted. No lateral joint line, no MCL, no LCL and no patellar tendon tenderness noted.  Neurological: He is alert and oriented to person, place, and time. He has normal strength. No cranial nerve deficit or sensory deficit. Gait abnormal. Coordination normal.  Psychiatric: He has a normal mood and affect. His behavior is normal. Thought content normal.          Assessment & Plan:  Start rehab and strenthening Protect knee at work and home Use Knee brace if active 2 week recheck

## 2013-04-15 NOTE — Patient Instructions (Signed)
Meniscus Tear with Phase II Rehab The meniscus is a C-shaped cartilage structure, located in the knee joint between the thigh bone (femur) and the shinbone (tibia). Two menisci are located in each knee joint: the inner and outer meniscus. The meniscus acts as an adapter between the thigh bone and shinbone, allowing them to fit properly together. It also functions as a shock absorber, to reduce the stress placed on the knee joint and to help supply nutrients to the knee joint cartilage. As people age, the meniscus begins to harden and become more vulnerable to injury. Meniscus tears are a common injury, especially in older athletes. Inner meniscus tears are more common than outer meniscus tears.  SYMPTOMS   Pain in the knee, especially with standing or squatting with the affected leg.  Tenderness along the joint line.  Swelling in the knee joint (effusion), usually starting 1 to 2 days after injury.  Locking or catching of the knee joint, causing inability to straighten the knee completely.  Giving way or buckling of the knee. CAUSES  A meniscus tear occurs when a force is placed on the meniscus that is greater than it can handle. Common causes of injury include:  Direct hit (trauma) to the knee.  Twisting, pivoting, or cutting (rapidly changing direction while running), kneeling or squatting.  Without injury, due to aging. RISK INCREASES WITH:  Contact sports (football, rugby).  Sports in which cleats are used with pivoting (soccer, lacrosse) or sports in which good shoe grip and sudden change in direction are required (racquetball, basketball, squash).  Previous knee injury.  Associated knee injury, particularly ligament injuries.  Poor strength and flexibility. PREVENTION  Warm up and stretch properly before activity.  Maintain physical fitness:  Strength, flexibility, and endurance.  Cardiovascular fitness.  Protect the knee with a brace or elastic bandage.  Wear  properly fitted protective equipment (proper cleats for the surface). PROGNOSIS  Sometimes, meniscus tears heal on their own. However, definitive treatment requires surgery, followed by at least 6 weeks of recovery.  RELATED COMPLICATIONS   Recurring symptoms that result in a chronic problem.  Repeated knee injury, especially if sports are resumed too soon after injury or surgery.  Progression of the tear (the tear gets larger), if untreated.  Arthritis of the knee in later years (with or without surgery).  Complications of surgery, including infection, bleeding, injury to nerves (numbness, weakness, paralysis) continued pain, giving way, locking, nonhealing of meniscus (if repaired), need for further surgery, and knee stiffness (loss of motion). TREATMENT  Treatment first involves the use of ice and medicine, to reduce pain and inflammation. You may find using crutches to walk more comfortable. However, it is okay to bear weight on the injured knee, if the pain will allow it. Surgery is often advised as a definitive treatment. Surgery is performed through an incision near the joint (arthroscopically). The torn piece of the meniscus is removed, and if possible the joint cartilage is repaired. After surgery, the joint must be restrained. After restraint, it is important to perform strengthening and stretching exercises to help regain strength and a full range of motion. These exercises may be completed at home or with a therapist.  MEDICATION   If pain medicine is needed, nonsteroidal anti-inflammatory medicines (aspirin and ibuprofen), or other minor pain relievers (acetaminophen), are often advised.  Do not take pain medicine for 7 days before surgery.  Prescription pain relievers may be given, if your caregiver thinks they are needed. Use only as directed  and only as much as you need. HEAT AND COLD:  Cold treatment (icing) should be applied for 10 to 15 minutes every 2 to 3 hours for  inflammation and pain, and immediately after activity that aggravates your symptoms. Use ice packs or an ice massage.  Heat treatment may be used before performing stretching and strengthening activities prescribed by your caregiver, physical therapist, or athletic trainer. Use a heat pack or a warm water soak. SEEK MEDICAL CARE IF:  Symptoms get worse or do not improve in 2 weeks, despite treatment.  New, unexplained symptoms develop. (Drugs used in treatment may produce side effects.) EXERCISES RANGE OF MOTION (ROM) AND STRETCHING EXERCISES - Meniscus Tear, Non-operative Phase II After your physician, physical therapist or athletic trainer feels your knee has made progress significant enough to begin more advanced exercises, he or she may recommend some of the exercises that follow. He or she may also advise you to continue with the exercises which you completed in Phase I of your rehabilitation. While completing these exercises, remember:   Restoring tissue flexibility helps normal motion to return to the joints. This allows healthier, less painful movement and activity.  An effective stretch should be held for at least 30 seconds.  A stretch should never be painful. You should only feel a gentle lengthening or release in the stretched tissue. STRETCH - Quadriceps, Prone   Lie on your stomach on a firm surface, such as a bed or padded floor.  Bend your right / left knee and grasp your ankle. If you are unable to reach your ankle or pant leg, use a belt around your foot to lengthen your reach.  Gently pull your heel toward your buttocks. Your knee should not slide out to the side. You should feel a stretch in the front of your thigh and knee.  Hold this position for __________ seconds. Repeat __________ times. Complete this stretch __________ times per day.  STRETCH - Knee Extension, Prone  Lie on your stomach on a firm surface, such as a bed or countertop. Place your right / left knee  and leg just beyond the edge of the surface. You may wish to place a towel under the far end of your right / left thigh for comfort.  Relax your leg muscles and allow gravity to straighten your knee. Your caregiver may advise you to add an ankle weight, if more resistance is helpful for you.  You should feel a stretch in the back of your right / left knee. Hold this position for __________ seconds. Repeat __________ times. Complete this __________ times per day. STRENGTHENING EXERCISES - Meniscus Tear Phase II These are some of the exercises you may progress to in your rehabilitation program. It is critical that you follow the instructions of your caregiver. Based on your individual needs, your caregiver may choose a more or less aggressive approach than the exercises presented. Remember:   Strong muscles with good endurance tolerate stress better.  Do the exercises as initially prescribed by your caregiver. Progress slowly with each exercise, gradually increasing the number of repetitions and weight used under his or her guidance. STRENGTH - Quadriceps, Short Arcs   Lie on your back. Place a __________ inch towel roll under your right / left knee, so that the knee bends slightly.  Raise only your lower leg by tightening the muscles in the front of your thigh. Do not allow your thigh to rise.  Hold this position for __________ seconds. Repeat __________ times.  Complete this exercise __________ times per day.  OPTIONAL ANKLE WEIGHTS: Begin with ____________________, but DO NOT exceed ____________________. Increase in 1 pound/0.5 kilogram increments. STRENGTH - Quadriceps, Step-Ups   Use a thick book, step or step stool that is __________ inches tall.  Hold a wall or counter for balance only, not support.  Slowly step up with your right / left foot, keeping your knee in line with your hip and foot. Do not allow your knee to bend so far that you cannot see your toes.  Slowly unlock your  knee and lower yourself to the starting position. Your muscles, not gravity, should lower you. Repeat __________ times. Complete this exercise __________ times per day.  STRENGTH - Quadriceps, Wall Slides  Follow guidelines for form closely. Increased knee pain often results from poorly placed feet or knees.  Lean against a smooth wall or door and walk your feet out 18-24 inches. Place your feet hip width apart.  Slowly slide down the wall or door until your knees bend __________ degrees.* Keep your knees over your heels, not your toes, and in line with your hips, not falling to either side.  Hold for __________ seconds. Stand up to rest for __________ seconds in between each repetition. Repeat __________ times. Complete this exercise __________ times per day. * Your physician, physical therapist or athletic trainer will alter this angle based on your symptoms and progress. STRENGTH - Hamstring, Curls  Lay on your stomach with your legs extended. (If you lay on a bed, your feet may hang over the edge.)  Tighten the muscles in the back of your thigh to bend your right / left knee up to 90 degrees. Keep your hips flat on the bed.  Hold this position for __________ seconds.  Slowly lower your leg back to the starting position. Repeat __________ times. Complete this exercise __________ times per day.  OPTIONAL ANKLE WEIGHTS: Begin with ____________________, but DO NOT exceed ____________________. Increase in 1 pound/0.5 kilogram increments. Document Released: 07/18/2005 Document Revised: 06/19/2011 Document Reviewed: 07/09/2008 [facility] Patient Information 2014 [place], [place].

## 2014-04-28 ENCOUNTER — Ambulatory Visit (INDEPENDENT_AMBULATORY_CARE_PROVIDER_SITE_OTHER): Payer: PRIVATE HEALTH INSURANCE | Admitting: Family Medicine

## 2014-04-28 VITALS — BP 122/70 | HR 66 | Temp 98.7°F | Resp 16 | Ht 62.5 in | Wt 181.0 lb

## 2014-04-28 DIAGNOSIS — K219 Gastro-esophageal reflux disease without esophagitis: Secondary | ICD-10-CM

## 2014-04-28 DIAGNOSIS — K602 Anal fissure, unspecified: Secondary | ICD-10-CM

## 2014-04-28 MED ORDER — DILTIAZEM GEL 2 %: 1.0000 "application " | Freq: Three times a day (TID) | CUTANEOUS | Status: DC

## 2014-04-28 MED ORDER — LIDOCAINE (ANORECTAL) 5 % EX GEL: CUTANEOUS | Status: DC

## 2014-04-28 MED ORDER — OMEPRAZOLE 20 MG PO CPDR: 20.0000 mg | DELAYED_RELEASE_CAPSULE | Freq: Every day | ORAL | Status: DC

## 2014-04-28 NOTE — Progress Notes (Signed)
Subjective:  This chart was scribe for Derek Sacramento, MD by Angelene Giovanni, ED Scribe. The patient was seen in Room 14 and the patient's care was started at 9:09 AM.    Patient ID: Derek Hopkins, male    DOB: 1970-12-05, 44 y.o.   MRN: [id number]  HPI HPI Comments: Derek Hopkins is a 44 y.o. male who presents to the [facility] to check for a possible hemorrhoid onset 2 months ago. He reports a tender and irritated sore/lump. He explains that the hemorrhoids were getting better until he noticed a lump. He reports using medicated wipes with relief. He states that he has a BM every other day which is usual for him however when he has a BM, he does not seem to finish. He denies constipation and strain when having a BM. He denies any bleeding in the last couple of days. He also denies any CAD in his family hx.  He c/o belching onset a couple of weeks ago. He reports occasional reflux and heart burn when he belches. He denies fever, chills, unexpected weight loss, abdominal pain, and a black tarry stool. He states that he has tried to change his diet in the past week to be more healthy. He denies a hx of ulcers. He reports that he rarely drinks alcohol, only about 4 times in a year and he denies smoking.   He is also here to establish care for a PCP here.   There are no active problems to display for this patient.  No past medical history on file. No past surgical history on file. No Known Allergies Prior to Admission medications   Not on File      Review of Systems  Constitutional: Negative for fever, chills, diaphoresis and unexpected weight change.  Gastrointestinal: Negative for abdominal pain and constipation.       Objective:   Physical Exam  Constitutional: He is oriented to person, place, and time. He appears well-developed and well-nourished. No distress.  HENT:  Head: Normocephalic and atraumatic.  Eyes: Conjunctivae and EOM are normal. Pupils are equal,  round, and reactive to light.  Neck: Neck supple. No JVD present. Carotid bruit is not present. No tracheal deviation present.  Cardiovascular: Normal rate, regular rhythm and normal heart sounds.   No murmur heard. Pulmonary/Chest: Effort normal and breath sounds normal. No respiratory distress. He has no rales.  Abdominal: There is tenderness. There is no rebound and no guarding.  Minimal epigastric tenderness. Negative Murphy's sign and negative McBurney's point   Genitourinary:  No visible hemorrhoids but small fissure at 12 o-clock without active bleeding.   Musculoskeletal: Normal range of motion. He exhibits no edema.  Neurological: He is alert and oriented to person, place, and time.  Skin: Skin is warm and dry.  Psychiatric: He has a normal mood and affect. His behavior is normal.  Nursing note and vitals reviewed.  Filed Vitals:   04/28/14 0855  BP: 122/70  Pulse: 66  Temp: 98.7 F (37.1 C)  Resp: 16  Height: 5' 2.5" (1.588 m)  Weight: 181 lb (82.101 kg)  SpO2: 97%          Assessment & Plan:   Derek Hopkins is a 44 y.o. male Anal fissure - Plan: diltiazem 2 % GEL, Lidocaine, Anorectal, 5 % GEL  -small fissure on exam.  lidocaine and diltiazem gel discussed, stool softener if needed, fiber and incr fluids in diet.  H/o below. rtc precautions.   Gastroesophageal  reflux disease, esophagitis presence not specified - Plan: omeprazole (PRILOSEC) 20 MG capsule   Start PPI, avoid trigger foods, and if not improved in next week - rtc. ER/rtc precautions  Will schedule appt to establish care/CPE.   Meds ordered this encounter  Medications  . diltiazem 2 % GEL    Sig: Apply 1 application topically 3 (three) times daily. Pea sized amount to affected area tid prn    Dispense:  30 g    Refill:  0  . Lidocaine, Anorectal, 5 % GEL    Sig: Apply pea sized amount to affected area tid prn.    Dispense:  30 g    Refill:  0  . omeprazole (PRILOSEC) 20 MG capsule    Sig: Take  1 capsule (20 mg total) by mouth daily.    Dispense:  30 capsule    Refill:  1   Patient Instructions  Apply ointments to affected area for anal fissure. Avoid foods that trigger heartburn, and start omeprazole. Fiber and increase fluids in diet. Recheck in 1 week, sooner if worse, or any new chest pains, shortness of breath or sweating.  Return to the clinic or go to the nearest emergency room if any of your symptoms worsen or new symptoms occur.  Anal Fissure, Adult An anal fissure is a small tear or crack in the skin around the anus. Bleeding from a fissure usually stops on its own within a few minutes. However, bleeding will often reoccur with each bowel movement until the crack heals.  CAUSES   Passing large, hard stools.  Frequent diarrheal stools.  Constipation.  Inflammatory bowel disease (Crohn's disease or ulcerative colitis).  Infections.  Anal sex. SYMPTOMS   Small amounts of blood seen on your stools, on toilet paper, or in the toilet after a bowel movement.  Rectal bleeding.  Painful bowel movements.  Itching or irritation around the anus. DIAGNOSIS Your caregiver will examine the anal area. An anal fissure can usually be seen with careful inspection. A rectal exam may be performed and a short tube (anoscope) may be used to examine the anal canal. TREATMENT   You may be instructed to take fiber supplements. These supplements can soften your stool to help make bowel movements easier.  Sitz baths may be recommended to help heal the tear. Do not use soap in the sitz baths.  A medicated cream or ointment may be prescribed to lessen discomfort. HOME CARE INSTRUCTIONS   Maintain a diet high in fruits, whole grains, and vegetables. Avoid constipating foods like bananas and dairy products.  Take sitz baths as directed by your caregiver.  Drink enough fluids to keep your urine clear or pale yellow.  Only take over-the-counter or prescription medicines for pain,  discomfort, or fever as directed by your caregiver. Do not take aspirin as this may increase bleeding.  Do not use ointments containing numbing medications (anesthetics) or hydrocortisone. They could slow healing. SEEK MEDICAL CARE IF:   Your fissure is not completely healed within 3 days.  You have further bleeding.  You have a fever.  You have diarrhea mixed with blood.  You have pain.  Your problem is getting worse rather than better. MAKE SURE YOU:   Understand these instructions.  Will watch your condition.  Will get help right away if you are not doing well or get worse. Document Released: 03/27/2005 Document Revised: 06/19/2011 Document Reviewed: 09/11/2010 [facility] Patient Information 2015 [place], [place]. This information is not intended to replace advice  given to you by your health care provider. Make sure you discuss any questions you have with your health care provider.  Food Choices for Gastroesophageal Reflux Disease When you have gastroesophageal reflux disease (GERD), the foods you eat and your eating habits are very important. Choosing the right foods can help ease the discomfort of GERD. WHAT GENERAL GUIDELINES DO I NEED TO FOLLOW?  Choose fruits, vegetables, whole grains, low-fat dairy products, and low-fat meat, fish, and poultry.  Limit fats such as oils, salad dressings, butter, nuts, and avocado.  Keep a food diary to identify foods that cause symptoms.  Avoid foods that cause reflux. These may be different for different people.  Eat frequent small meals instead of three large meals each day.  Eat your meals slowly, in a relaxed setting.  Limit fried foods.  Cook foods using methods other than frying.  Avoid drinking alcohol.  Avoid drinking large amounts of liquids with your meals.  Avoid bending over or lying down until 2-3 hours after eating. WHAT FOODS ARE NOT RECOMMENDED? The following are some foods and drinks that may worsen your  symptoms: Vegetables Tomatoes. Tomato juice. Tomato and spaghetti sauce. Chili peppers. Onion and garlic. Horseradish. Fruits Oranges, grapefruit, and lemon (fruit and juice). Meats High-fat meats, fish, and poultry. This includes hot dogs, ribs, ham, sausage, salami, and bacon. Dairy Whole milk and chocolate milk. Sour cream. Cream. Butter. Ice cream. Cream cheese.  Beverages Coffee and tea, with or without caffeine. Carbonated beverages or energy drinks. Condiments Hot sauce. Barbecue sauce.  Sweets/Desserts Chocolate and cocoa. Donuts. Peppermint and spearmint. Fats and Oils High-fat foods, including [place] fries and potato chips. Other Vinegar. Strong spices, such as black pepper, white pepper, red pepper, cayenne, curry powder, cloves, ginger, and chili powder. The items listed above may not be a complete list of foods and beverages to avoid. Contact your dietitian for more information. Document Released: 03/27/2005 Document Revised: 04/01/2013 Document Reviewed: 01/29/2013 [facility] Patient Information 2015 [place], [place]. This information is not intended to replace advice given to you by your health care provider. Make sure you discuss any questions you have with your health care provider.     I personally performed the services described in this documentation, which was scribed in my presence. The recorded information has been reviewed and considered, and addended by me as needed.

## 2014-04-28 NOTE — Patient Instructions (Signed)
Apply ointments to affected area for anal fissure. Avoid foods that trigger heartburn, and start omeprazole. Fiber and increase fluids in diet. Recheck in 1 week, sooner if worse, or any new chest pains, shortness of breath or sweating.  Return to the clinic or go to the nearest emergency room if any of your symptoms worsen or new symptoms occur.  Anal Fissure, Adult An anal fissure is a small tear or crack in the skin around the anus. Bleeding from a fissure usually stops on its own within a few minutes. However, bleeding will often reoccur with each bowel movement until the crack heals.  CAUSES   Passing large, hard stools.  Frequent diarrheal stools.  Constipation.  Inflammatory bowel disease (Crohn's disease or ulcerative colitis).  Infections.  Anal sex. SYMPTOMS   Small amounts of blood seen on your stools, on toilet paper, or in the toilet after a bowel movement.  Rectal bleeding.  Painful bowel movements.  Itching or irritation around the anus. DIAGNOSIS Your caregiver will examine the anal area. An anal fissure can usually be seen with careful inspection. A rectal exam may be performed and a short tube (anoscope) may be used to examine the anal canal. TREATMENT   You may be instructed to take fiber supplements. These supplements can soften your stool to help make bowel movements easier.  Sitz baths may be recommended to help heal the tear. Do not use soap in the sitz baths.  A medicated cream or ointment may be prescribed to lessen discomfort. HOME CARE INSTRUCTIONS   Maintain a diet high in fruits, whole grains, and vegetables. Avoid constipating foods like bananas and dairy products.  Take sitz baths as directed by your caregiver.  Drink enough fluids to keep your urine clear or pale yellow.  Only take over-the-counter or prescription medicines for pain, discomfort, or fever as directed by your caregiver. Do not take aspirin as this may increase bleeding.  Do  not use ointments containing numbing medications (anesthetics) or hydrocortisone. They could slow healing. SEEK MEDICAL CARE IF:   Your fissure is not completely healed within 3 days.  You have further bleeding.  You have a fever.  You have diarrhea mixed with blood.  You have pain.  Your problem is getting worse rather than better. MAKE SURE YOU:   Understand these instructions.  Will watch your condition.  Will get help right away if you are not doing well or get worse. Document Released: 03/27/2005 Document Revised: 06/19/2011 Document Reviewed: 09/11/2010 [facility] Patient Information 2015 [place], [place]. This information is not intended to replace advice given to you by your health care provider. Make sure you discuss any questions you have with your health care provider.  Food Choices for Gastroesophageal Reflux Disease When you have gastroesophageal reflux disease (GERD), the foods you eat and your eating habits are very important. Choosing the right foods can help ease the discomfort of GERD. WHAT GENERAL GUIDELINES DO I NEED TO FOLLOW?  Choose fruits, vegetables, whole grains, low-fat dairy products, and low-fat meat, fish, and poultry.  Limit fats such as oils, salad dressings, butter, nuts, and avocado.  Keep a food diary to identify foods that cause symptoms.  Avoid foods that cause reflux. These may be different for different people.  Eat frequent small meals instead of three large meals each day.  Eat your meals slowly, in a relaxed setting.  Limit fried foods.  Cook foods using methods other than frying.  Avoid drinking alcohol.  Avoid drinking large  amounts of liquids with your meals.  Avoid bending over or lying down until 2-3 hours after eating. WHAT FOODS ARE NOT RECOMMENDED? The following are some foods and drinks that may worsen your symptoms: Vegetables Tomatoes. Tomato juice. Tomato and spaghetti sauce. Chili peppers. Onion and garlic.  Horseradish. Fruits Oranges, grapefruit, and lemon (fruit and juice). Meats High-fat meats, fish, and poultry. This includes hot dogs, ribs, ham, sausage, salami, and bacon. Dairy Whole milk and chocolate milk. Sour cream. Cream. Butter. Ice cream. Cream cheese.  Beverages Coffee and tea, with or without caffeine. Carbonated beverages or energy drinks. Condiments Hot sauce. Barbecue sauce.  Sweets/Desserts Chocolate and cocoa. Donuts. Peppermint and spearmint. Fats and Oils High-fat foods, including [place] fries and potato chips. Other Vinegar. Strong spices, such as black pepper, white pepper, red pepper, cayenne, curry powder, cloves, ginger, and chili powder. The items listed above may not be a complete list of foods and beverages to avoid. Contact your dietitian for more information. Document Released: 03/27/2005 Document Revised: 04/01/2013 Document Reviewed: 01/29/2013 [facility] Patient Information 2015 [place], [place]. This information is not intended to replace advice given to you by your health care provider. Make sure you discuss any questions you have with your health care provider.

## 2014-04-29 NOTE — Progress Notes (Signed)
Appointment for CPE made for 05/18/2014 @ 11am.  Patient notified via phone.

## 2014-05-18 ENCOUNTER — Encounter: Payer: Self-pay | Admitting: Family Medicine

## 2014-05-18 ENCOUNTER — Ambulatory Visit (INDEPENDENT_AMBULATORY_CARE_PROVIDER_SITE_OTHER): Payer: PRIVATE HEALTH INSURANCE | Admitting: Family Medicine

## 2014-05-18 VITALS — BP 138/80 | HR 61 | Temp 98.5°F | Resp 16 | Ht 63.5 in | Wt 179.0 lb

## 2014-05-18 DIAGNOSIS — Z130 Encounter for screening for diseases of the blood and blood-forming organs and certain disorders involving the immune mechanism: Secondary | ICD-10-CM

## 2014-05-18 DIAGNOSIS — K602 Anal fissure, unspecified: Secondary | ICD-10-CM

## 2014-05-18 DIAGNOSIS — N528 Other male erectile dysfunction: Secondary | ICD-10-CM

## 2014-05-18 DIAGNOSIS — Z131 Encounter for screening for diabetes mellitus: Secondary | ICD-10-CM

## 2014-05-18 DIAGNOSIS — Z0000 Encounter for general adult medical examination without abnormal findings: Secondary | ICD-10-CM

## 2014-05-18 DIAGNOSIS — R0683 Snoring: Secondary | ICD-10-CM

## 2014-05-18 DIAGNOSIS — Z23 Encounter for immunization: Secondary | ICD-10-CM

## 2014-05-18 DIAGNOSIS — Z125 Encounter for screening for malignant neoplasm of prostate: Secondary | ICD-10-CM

## 2014-05-18 DIAGNOSIS — Z13220 Encounter for screening for lipoid disorders: Secondary | ICD-10-CM

## 2014-05-18 LAB — LIPID PANEL
Cholesterol: 172 mg/dL (ref 0–200)
HDL: 39 mg/dL — ABNORMAL LOW (ref >39)
LDL Cholesterol: 110 mg/dL — ABNORMAL HIGH (ref 0–99)
Total CHOL/HDL Ratio: 4.4 Ratio
Triglycerides: 117 mg/dL (ref <150)
VLDL: 23 mg/dL (ref 0–40)

## 2014-05-18 LAB — CBC
HEMATOCRIT: 44.8 % (ref 39.0–52.0)
Hemoglobin: 15.2 g/dL (ref 13.0–17.0)
MCH: 30.6 pg (ref 26.0–34.0)
MCHC: 33.9 g/dL (ref 30.0–36.0)
MCV: 90.3 fL (ref 78.0–100.0)
MPV: 9.8 fL (ref 8.6–12.4)
PLATELETS: 265 10*3/uL (ref 150–400)
RBC: 4.96 MIL/uL (ref 4.22–5.81)
RDW: 13.4 % (ref 11.5–15.5)
WBC: 7.7 10*3/uL (ref 4.0–10.5)

## 2014-05-18 LAB — COMPREHENSIVE METABOLIC PANEL
ALT: 31 U/L (ref 0–53)
AST: 19 U/L (ref 0–37)
Albumin: 4.3 g/dL (ref 3.5–5.2)
Alkaline Phosphatase: 75 U/L (ref 39–117)
BUN: 12 mg/dL (ref 6–23)
CALCIUM: 9.4 mg/dL (ref 8.4–10.5)
CHLORIDE: 103 meq/L (ref 96–112)
CO2: 27 meq/L (ref 19–32)
Creat: 1.11 mg/dL (ref 0.50–1.35)
Glucose, Bld: 86 mg/dL (ref 70–99)
Potassium: 4.1 mEq/L (ref 3.5–5.3)
SODIUM: 139 meq/L (ref 135–145)
Total Bilirubin: 0.6 mg/dL (ref 0.2–1.2)
Total Protein: 7.7 g/dL (ref 6.0–8.3)

## 2014-05-18 MED ORDER — SILDENAFIL CITRATE 20 MG PO TABS: 20.0000 mg | ORAL_TABLET | Freq: Three times a day (TID) | ORAL | Status: DC

## 2014-05-18 MED ORDER — SILDENAFIL CITRATE 20 MG PO TABS: ORAL_TABLET | ORAL | Status: DC

## 2014-05-18 MED ORDER — DILTIAZEM GEL 2 %: 1.0000 "application " | Freq: Three times a day (TID) | CUTANEOUS | Status: DC

## 2014-05-18 NOTE — Patient Instructions (Addendum)
I will be in touch with your labs asap. [facility] (for the anal fissure); let me know if this does not clear up your symptoms Try the sildenafil (viagra) as needed for erectile dysfunction.   I will get you set up for a sleep study to see if you may have sleep apnea.   In the meantime, work on exercising 5 days a week- start with 20 minutes and work up to an hour.  Also cut back on your portionsizes some- we would like you to lose about 1 lbs a week with a goal of losing 12- 15 lbs to start.

## 2014-05-18 NOTE — Progress Notes (Signed)
[facility] [address], [place] [id number] [phone]  Date:  05/18/2014   Name:  Derek SeveranceGary Sarin   DOB:  03-07-1971   MRN:  [id number]  PCP:  No PCP Per Patient    Chief Complaint: Annual Exam   History of Present Illness:  Derek SeveranceGary Hopkins is a 44 y.o. very pleasant male patient who presents with the following:  Hew is here today for a CPE.  He has not eaten today.  He was seen for an anal fissure last month- he was given an rx for diltiazem gel but his [facility] could not fill it so he did not take it.  He notes some persistent anal irritation but is overall better.    BP Readings from Last 3 Encounters:  05/18/14 155/85  04/28/14 122/70  04/15/13 126/78   He has fallen a couple of times this year, but this was just "bad luck," and nothing pathologic.  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  However he does not have a regular exercise program  He is single, he is SA with females. He is a non- smoker, drinks alcohol occasionally He does not have am erections as much any longer.  He has noted some difficulty with sexual performance He also has noted that he has gained a few lbs over the last several years.  Admits to drinking some sodas but he is trying to cut back.  He does snore, and sometimes feels sleepy during the day.    He does not have any chest pain when he does exercise  No history of any heart problems   There are no active problems to display for this patient.   No past medical history on file.  No past surgical history on file.  History  Substance Use Topics  . Smoking status: Former Games developermoker  . Smokeless tobacco: Not on file  . Alcohol Use: Yes    Family History  Problem Relation Age of Onset  . Cancer Mother   . Cancer Father     No Known Allergies  Medication list has been reviewed and updated.  Current Outpatient Prescriptions on File Prior to Visit  Medication  Sig Dispense Refill  . omeprazole (PRILOSEC) 20 MG capsule Take 1 capsule (20 mg total) by mouth daily. 30 capsule 1  . diltiazem 2 % GEL Apply 1 application topically 3 (three) times daily. Pea sized amount to affected area tid prn (Patient not taking: Reported on 05/18/2014) 30 g 0  . Lidocaine, Anorectal, 5 % GEL Apply pea sized amount to affected area tid prn. (Patient not taking: Reported on 05/18/2014) 30 g 0   No current facility-administered medications on file prior to visit.    Review of Systems:  As per HPI- otherwise negative.   Physical Examination: Filed Vitals:   05/18/14 1106  BP: 155/85  Pulse: 61  Temp: 98.5 F (36.9 C)  Resp: 16   Filed Vitals:   05/18/14 1106  Height: 5' 3.5" (1.613 m)  Weight: 179 lb (81.194 kg)   Body mass index is 31.21 kg/(m^2). Ideal Body Weight: Weight in (lb) to have BMI = 25: 143.1  GEN: WDWN, NAD, Non-toxic, A & O x 3, overweight, looks well HEENT: Atraumatic, Normocephalic. Neck supple. No masses, No LAD.  Bilateral TM wnl, oropharynx normal.  PEERL,EOMI.   He has a thick neck and small jaw which may predispose to sleep apnea  Ears and Nose: No external deformity. CV: RRR, No M/G/R. No JVD. No thrill. No extra heart sounds. PULM: CTA B, no wheezes, crackles, rhonchi. No retractions. No resp. distress. No accessory muscle use. ABD: S, NT, ND, +BS. No rebound. No HSM. EXTR: No c/c/e NEURO Normal gait.  PSYCH: Normally interactive. Conversant. Not depressed or anxious appearing.  Calm demeanor.  GU: normal genitals and prostate   Assessment and Plan: Physical exam  Anal fissure - Plan: diltiazem 2 % GEL, DISCONTINUED: diltiazem 2 % GEL  Screening for hyperlipidemia - Plan: Lipid panel  Screening for diabetes mellitus - Plan: Comprehensive metabolic panel  Screening for deficiency anemia - Plan: CBC  Snoring - Plan: Nocturnal polysomnography (NPSG)  Other male erectile dysfunction - Plan: sildenafil (REVATIO) 20 MG tablet,  DISCONTINUED: sildenafil (REVATIO) 20 MG tablet  Immunization due - Plan: Tdap vaccine greater than or equal to 7yo IM  Screening for prostate cancer - Plan: PSA  tdap today. He declines flu shot Await labs Called diltiazem gel to [place] for him Borderline BP, over weight: encouraged exercise and weight loss Will refer for a sleep study Trial of generic sildenafil (revatio) for his ED.  Explained use  Will plan further follow- up pending labs.   Signed Abbe Amsterdam, MD

## 2014-05-19 ENCOUNTER — Encounter: Payer: Self-pay | Admitting: Family Medicine

## 2014-05-19 LAB — PSA: PSA: 0.61 ng/mL (ref <=4.00)

## 2014-06-19 ENCOUNTER — Ambulatory Visit (INDEPENDENT_AMBULATORY_CARE_PROVIDER_SITE_OTHER): Payer: PRIVATE HEALTH INSURANCE | Admitting: Neurology

## 2014-06-19 ENCOUNTER — Encounter: Payer: Self-pay | Admitting: Neurology

## 2014-06-19 VITALS — BP 126/77 | HR 67 | Temp 98.8°F | Resp 16 | Ht 63.0 in | Wt 180.0 lb

## 2014-06-19 DIAGNOSIS — K219 Gastro-esophageal reflux disease without esophagitis: Secondary | ICD-10-CM

## 2014-06-19 DIAGNOSIS — R0683 Snoring: Secondary | ICD-10-CM | POA: Diagnosis not present

## 2014-06-19 DIAGNOSIS — R351 Nocturia: Secondary | ICD-10-CM

## 2014-06-19 DIAGNOSIS — M67972 Unspecified disorder of synovium and tendon, left ankle and foot: Secondary | ICD-10-CM

## 2014-06-19 DIAGNOSIS — G4719 Other hypersomnia: Secondary | ICD-10-CM

## 2014-06-19 DIAGNOSIS — G479 Sleep disorder, unspecified: Secondary | ICD-10-CM

## 2014-06-19 DIAGNOSIS — G478 Other sleep disorders: Secondary | ICD-10-CM

## 2014-06-19 NOTE — Patient Instructions (Signed)

## 2014-06-19 NOTE — Progress Notes (Signed)
Subjective:    Patient ID: Derek Hopkins is a 44 y.o. male.  HPI     Huston Foley, MD, PhD [facility] [address] [address] [place], [place] [address]  Dear Dr. Patsy Lager,   I saw your patient, Derek Hopkins, upon your kind request in my neurologic clinic today for initial consultation of his sleep disorder, in particular, concern for underlying obstructive sleep apnea. The patient is unaccompanied today. As you know, Mr. Derek Hopkins is a 44 year old right-handed gentleman with an underlying medical history of reflux disease, obesity and erectile dysfunction, who reports snoring and daytime somnolence. This has been an ongoing problem for months to maybe years. He has trouble maintaining sleep. He tries to get enough sleep. He usually is in bed by 9 or 9:30 on most nights. While he falls asleep okay he has usually early morning awakening right around 2 AM and usually goes to the bathroom then. He then has a difficult time going back to sleep. He snores and this can be moderate. He usually does not sleep with anyone. 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 He has a 82 year old son who stays with him and also with his mother. The patient is single and lives alone otherwise. He has very little caffeine and is trying to reduce his soda intake and lose weight. He does not smoke. He does not use any illicit drugs. There is a family history of stroke but no restless leg syndrome or obstructive sleep apnea. The patient himself denies actual restless leg symptoms but is not sure if he twitches in his sleep. He is a restless sleeper and tosses and turns a lot. He prefers to sleep on his stomach or his sides. He has never fallen asleep at work or at the wheel. His Epworth sleepiness score is 12 out of 24 today. His wake time is around 5 AM and he does not wake up rested. He usually does not wake up with a headache. He denies parasomnias or cataplexy or sleep paralysis  or sleep attacks. In the past year, symptoms have become worse. He would like to feel better rested and have a full night sleep. He has never taken prescription or over-the-counter sleep aids.  His Past Medical History Is Significant For: No past medical history on file.  His Past Surgical History Is Significant For: Past Surgical History  Procedure Laterality Date  . None      His Family History Is Significant For: Family History  Problem Relation Age of Onset  . Cancer Mother   . Cancer Father     His Social History Is Significant For: History   Social History  . Marital Status: Single    Spouse Name: N/A  . Number of Children: 1  . Years of Education: BS   Occupational History  . [employer]    Social History Main Topics  . Smoking status: Never Smoker   . Smokeless tobacco: Not on file  . Alcohol Use: 0.0 oz/week    0 Standard drinks or equivalent per week     Comment: Rare occation  . Drug Use: No     Comment: Quit 2002  . Sexual Activity: Not on file   Other Topics Concern  . None   Social History Narrative    His Allergies Are:  No Known Allergies:   His Current Medications Are:  Outpatient Encounter Prescriptions as of 06/19/2014  Medication Sig  . Inulin (FIBER CHOICE  FRUITY BITES PO) Take by mouth.  . [DISCONTINUED] diltiazem 2 % GEL Apply 1 application topically 3 (three) times daily. Pea sized amount to affected area tid prn  . [DISCONTINUED] Lidocaine, Anorectal, 5 % GEL Apply pea sized amount to affected area tid prn. (Patient not taking: Reported on 05/18/2014)  . [DISCONTINUED] omeprazole (PRILOSEC) 20 MG capsule Take 1 capsule (20 mg total) by mouth daily.  . [DISCONTINUED] sildenafil (REVATIO) 20 MG tablet Take 20- 60 mg as needed for erectile dysfunction  :  Review of Systems:  Out of a complete 14 point review of systems, all are reviewed and negative with the exception of these symptoms as listed below:  Review of Systems   Neurological:       Sleepiness, snoring, Not enough sleep    Objective:  Neurologic Exam  Physical Exam Physical Examination:   Filed Vitals:   06/19/14 0910  BP: 126/77  Pulse: 67  Temp: 98.8 F (37.1 C)  Resp: 16    General Examination: The patient is a very pleasant 44 y.o. male in no acute distress. He appears well-developed and well-nourished and well groomed.   HEENT: Normocephalic, atraumatic, pupils are equal, round and reactive to light and accommodation. Funduscopic exam is normal with sharp disc margins noted. Extraocular tracking is good without limitation to gaze excursion or nystagmus noted. Normal smooth pursuit is noted. Hearing is grossly intact. Tympanic membranes are clear bilaterally. Face is symmetric with normal facial animation and normal facial sensation. Speech is clear with no dysarthria noted. There is no hypophonia. There is no lip, neck/head, jaw or voice tremor. Neck is supple with full range of passive and active motion. There are no carotid bruits on auscultation. Oropharynx exam reveals: mild mouth dryness, good dental hygiene and moderate airway crowding, due to narrow airway entry, larger uvula and elongated tongue, tonsils in place at 2+. Neck size is 16-3/4 inches. Mallampati is class II. Nasal inspection is unremarkable. He has a mild overbite.  Chest: Clear to auscultation without wheezing, rhonchi or crackles noted.  Heart: S1+S2+0, regular and normal without murmurs, rubs or gallops noted.   Abdomen: Soft, non-tender and non-distended with normal bowel sounds appreciated on auscultation.  Extremities: There is no pitting edema in the distal lower extremities bilaterally. Pedal pulses are intact.  Skin: Warm and dry without trophic changes noted. There are no varicose veins.  Musculoskeletal: exam reveals no obvious joint deformities, tenderness or joint swelling or erythema, with the exception of tenderness of his left Achilles tendon. He  says he was playing sports and may have pulled something. He did not hear a pop and has still full range of motion but is somewhat tender..   Neurologically:  Mental status: The patient is awake, alert and oriented in all 4 spheres. His immediate and remote memory, attention, language skills and fund of knowledge are appropriate. There is no evidence of aphasia, agnosia, apraxia or anomia. Speech is clear with normal prosody and enunciation. Thought process is linear. Mood is normal and affect is normal.  Cranial nerves II - XII are as described above under HEENT exam. In addition: shoulder shrug is normal with equal shoulder height noted. Motor exam: Normal bulk, strength and tone is noted. There is no drift, tremor or rebound. Romberg is negative. Reflexes are 2+ throughout. Babinski: Toes are flexor bilaterally. Fine motor skills and coordination: intact with normal finger taps, normal hand movements, normal rapid alternating patting, normal foot taps and normal foot agility.  Cerebellar testing:  No dysmetria or intention tremor on finger to nose testing. Heel to shin is unremarkable bilaterally. There is no truncal or gait ataxia.  Sensory exam: intact to light touch, pinprick, vibration, temperature sense in the upper and lower extremities.  Gait, station and balance: He stands easily. No veering to one side is noted. No leaning to one side is noted. Posture is age-appropriate and stance is narrow based. Gait shows normal stride length and normal pace. No problems turning are noted. He turns en bloc. Tandem walk is unremarkable.           Assessment and Plan:   In summary, Jacquese Hackman is a very pleasant 44 y.o.-year old male with an underlying medical history of reflux disease, obesity and erectile dysfunction, who reports snoring and daytime somnolence. While not telltale, his history and physical exam are still concerning for obstructive sleep apnea (OSA). I had a long chat with the patient about  my findings and the diagnosis of OSA, its prognosis and treatment options. We talked about medical treatments, surgical interventions and non-pharmacological approaches. I explained in particular the risks and ramifications of untreated moderate to severe OSA, especially with respect to developing cardiovascular disease down the Road, including congestive heart failure, difficult to treat hypertension, cardiac arrhythmias, or stroke. Even type 2 diabetes has, in part, been linked to untreated OSA. Symptoms of untreated OSA include daytime sleepiness, memory problems, mood irritability and mood disorder such as depression and anxiety, lack of energy, as well as recurrent headaches, especially morning headaches. We talked about trying to maintain a healthy lifestyle in general, as well as the importance of weight control. I encouraged the patient to eat healthy, exercise daily and keep well hydrated, to keep a scheduled bedtime and wake time routine, to not skip any meals and eat healthy snacks in between meals. I advised the patient not to drive when feeling sleepy. I recommended the following at this time: sleep study with potential positive airway pressure titration. (We will score hypopneas at 3% and split the sleep study into diagnostic and treatment portion, if the estimated. 2 hour AHI is >15/h).   Regarding his reflux he is advised to use his medication. He has been using it as needed. He is avoiding spicy food and late night food. He is working on his weight loss. Furthermore, he is advised to watch his symptoms of his left foot and if his tenderness gets worse he may need to see a foot specialist. On exam he has full range of motion and no significant swelling of his tendon.  I explained the sleep test procedure to the patient and also outlined possible surgical and non-surgical treatment options of OSA, including the use of a custom-made dental device (which would require a referral to a specialist  dentist or oral surgeon), upper airway surgical options, such as pillar implants, radiofrequency surgery, tongue base surgery, and UPPP (which would involve a referral to an ENT surgeon). Rarely, jaw surgery such as mandibular advancement may be considered.  I also explained the CPAP treatment option to the patient, who indicated that he would be willing to try CPAP if the need arises. I explained the importance of being compliant with PAP treatment, not only for insurance purposes but primarily to improve His symptoms, and for the patient's long term health benefit, including to reduce His cardiovascular risks. I answered all his questions today and the patient was in agreement. I would like to see him back after the sleep study is  completed and encouraged him to call with any interim questions, concerns, problems or updates.   Thank you very much for allowing me to participate in the care of this nice patient. If I can be of any further assistance to you please do not hesitate to call me at [phone].  Sincerely,   Star Age, MD, PhD

## 2014-07-14 ENCOUNTER — Telehealth: Payer: Self-pay | Admitting: Neurology

## 2014-07-14 DIAGNOSIS — G479 Sleep disorder, unspecified: Secondary | ICD-10-CM

## 2014-07-14 DIAGNOSIS — R351 Nocturia: Secondary | ICD-10-CM

## 2014-07-14 DIAGNOSIS — R0683 Snoring: Secondary | ICD-10-CM

## 2014-07-14 DIAGNOSIS — G478 Other sleep disorders: Secondary | ICD-10-CM

## 2014-07-14 DIAGNOSIS — G4719 Other hypersomnia: Secondary | ICD-10-CM

## 2014-07-14 NOTE — Telephone Encounter (Signed)
This patient's insurance denied an attended sleep study. I will order home sleep test.  

## 2014-07-14 NOTE — Telephone Encounter (Signed)
Patients insurance [employer] has denied the in lab sleep study but in process of approving for patient to have a Home Sleep Study if you could place the order and once I receive the authorization I will schedule appointment.

## 2015-08-03 ENCOUNTER — Ambulatory Visit (INDEPENDENT_AMBULATORY_CARE_PROVIDER_SITE_OTHER): Payer: No Typology Code available for payment source

## 2015-08-03 ENCOUNTER — Ambulatory Visit (INDEPENDENT_AMBULATORY_CARE_PROVIDER_SITE_OTHER): Payer: No Typology Code available for payment source | Admitting: Family Medicine

## 2015-08-03 VITALS — BP 118/76 | HR 80 | Temp 98.4°F | Resp 18 | Ht 63.0 in | Wt 194.0 lb

## 2015-08-03 DIAGNOSIS — R350 Frequency of micturition: Secondary | ICD-10-CM

## 2015-08-03 DIAGNOSIS — R12 Heartburn: Secondary | ICD-10-CM | POA: Diagnosis not present

## 2015-08-03 DIAGNOSIS — R079 Chest pain, unspecified: Secondary | ICD-10-CM

## 2015-08-03 DIAGNOSIS — R1011 Right upper quadrant pain: Secondary | ICD-10-CM | POA: Diagnosis not present

## 2015-08-03 DIAGNOSIS — R109 Unspecified abdominal pain: Secondary | ICD-10-CM | POA: Diagnosis not present

## 2015-08-03 DIAGNOSIS — D72829 Elevated white blood cell count, unspecified: Secondary | ICD-10-CM

## 2015-08-03 LAB — POCT URINALYSIS DIP (MANUAL ENTRY)
BILIRUBIN UA: NEGATIVE
Bilirubin, UA: NEGATIVE
Blood, UA: NEGATIVE
GLUCOSE UA: NEGATIVE
Leukocytes, UA: NEGATIVE
Nitrite, UA: NEGATIVE
Protein Ur, POC: NEGATIVE
SPEC GRAV UA: 1.02
Urobilinogen, UA: 1
pH, UA: 5.5

## 2015-08-03 LAB — POCT CBC
GRANULOCYTE PERCENT: 66.2 % (ref 37–80)
HEMATOCRIT: 40.5 % — AB (ref 43.5–53.7)
Hemoglobin: 14.3 g/dL (ref 14.1–18.1)
Lymph, poc: 3.9 — AB (ref 0.6–3.4)
MCH, POC: 31.9 pg — AB (ref 27–31.2)
MCHC: 35.3 g/dL (ref 31.8–35.4)
MCV: 90.3 fL (ref 80–97)
MID (CBC): 0.7 (ref 0–0.9)
MPV: 7.1 fL (ref 0–99.8)
POC GRANULOCYTE: 8.9 — AB (ref 2–6.9)
POC LYMPH %: 28.9 % (ref 10–50)
POC MID %: 4.9 % (ref 0–12)
Platelet Count, POC: 238 10*3/uL (ref 142–424)
RBC: 4.48 M/uL — AB (ref 4.69–6.13)
RDW, POC: 12.5 %
WBC: 13.5 10*3/uL — AB (ref 4.6–10.2)

## 2015-08-03 LAB — POC MICROSCOPIC URINALYSIS (UMFC)

## 2015-08-03 LAB — GLUCOSE, POCT (MANUAL RESULT ENTRY): POC Glucose: 84 mg/dl (ref 70–99)

## 2015-08-03 NOTE — Progress Notes (Addendum)
Subjective:  By signing my name below, I, Derek Hopkins, attest that this documentation has been prepared under the direction and in the presence of Meredith Staggers, MD. Electronically Signed: Stann Hopkins, Scribe. 08/03/2015 , 7:05 PM .  Patient was seen in Room 11 .   Patient ID: Derek Hopkins, male    DOB: July 18, 1970, 45 y.o.   MRN: [id number] Chief Complaint  Patient presents with  . Abdominal Pain    Right  . Chest Pain   HPI Derek Hopkins is a 45 y.o. male He had a physical February 2016. Normal CBC and CMP, elevated borderline LDL at 110. Normal PSA. Here today with abdominal pain and chest pain.   Patient is here with intermittent chest pain and right flank pain that radiates to his back ongoing for 3 days. He notices most discomfort when he sits down and causes him to have the feeling to urinate. He mentions having some increased urinary frequency for the past 3-4 days. He also mentions feeling bloated with heartburn symptoms for a while, and has some relief with burping or when he passes gas. He's been drinking more water and it causes him to feel bloated. He denies association with certain foods. He denies pain in groin, testicular pain, nausea, vomiting, diarrhea, or blood in stool. He denies recent alcohol consumption. He denies radiating chest pain. He denies any known heart problems or early heart disease in family. He denies any worsening chest pain with exertion or activity.   He feels more fatigue since his physical exam last year.   There are no active problems to display for this patient.  History reviewed. No pertinent past medical history. Past Surgical History  Procedure Laterality Date  . None     No Known Allergies Prior to Admission medications   Medication Sig Start Date End Date Taking? Authorizing Provider  Inulin (FIBER CHOICE FRUITY BITES PO) Take by mouth. Reported on 08/03/2015    Historical Provider, MD   Social History   Social History  . Marital  Status: Single    Spouse Name: N/A  . Number of Children: 1  . Years of Education: BS   Occupational History  . [employer]    Social History Main Topics  . Smoking status: Never Smoker   . Smokeless tobacco: Not on file  . Alcohol Use: 0.0 oz/week    0 Standard drinks or equivalent per week     Comment: Rare occation  . Drug Use: No     Comment: Quit 2002  . Sexual Activity: Not on file   Other Topics Concern  . Not on file   Social History Narrative   Review of Systems  Constitutional: Positive for fatigue. Negative for fever, chills and appetite change.  Cardiovascular: Positive for chest pain.  Gastrointestinal: Positive for abdominal pain and abdominal distention. Negative for nausea, vomiting, diarrhea and blood in stool.  Genitourinary: Positive for frequency. Negative for testicular pain.       Objective:   Physical Exam  Constitutional: He is oriented to person, place, and time. He appears well-developed and well-nourished.  HENT:  Head: Normocephalic and atraumatic.  Eyes: EOM are normal. Pupils are equal, round, and reactive to light.  Neck: No JVD present. Carotid bruit is not present.  Cardiovascular: Normal rate, regular rhythm and normal heart sounds.  Exam reveals no gallop and no friction rub.   No murmur heard. Pulmonary/Chest: Effort normal and breath sounds normal. He has no rales.  Minimal  pain over anterior chest wall  Abdominal: There is no rebound, no guarding and negative Murphy's sign.  Slight discomfort in right rib margin  Genitourinary: Penis normal. Prostate is not tender (firm, but notender on exam. ). Right testis shows no tenderness. Left testis shows no tenderness.  no hernia palpated, no testicular tenderness  Musculoskeletal: He exhibits no edema.  Neurological: He is alert and oriented to person, place, and time.  Skin: Skin is warm and dry.  Psychiatric: He has a normal mood and affect.  Vitals reviewed.   Filed Vitals:     08/03/15 1715  BP: 118/76  Pulse: 80  Temp: 98.4 F (36.9 C)  TempSrc: Oral  Resp: 18  Height: 5\' 3"  (1.6 m)  Weight: 194 lb (87.998 kg)  SpO2: 98%   EKG: SR, no acute findings.   Results for orders placed or performed in visit on 08/03/15  POCT urinalysis dipstick  Result Value Ref Range   Color, UA yellow yellow   Clarity, UA cloudy (A) clear   Glucose, UA negative negative   Bilirubin, UA negative negative   Ketones, POC UA negative negative   Spec Grav, UA 1.020    Blood, UA negative negative   pH, UA 5.5    Protein Ur, POC negative negative   Urobilinogen, UA 1.0    Nitrite, UA Negative Negative   Leukocytes, UA Negative Negative  POCT Microscopic Urinalysis ([facility])  Result Value Ref Range   WBC,UR,HPF,POC None None WBC/hpf   RBC,UR,HPF,POC None None RBC/hpf   Bacteria Few (A) None, Too numerous to count   Mucus Present (A) Absent   Epithelial Cells, UR Per Microscopy Few (A) None, Too numerous to count cells/hpf  POCT CBC  Result Value Ref Range   WBC 13.5 (A) 4.6 - 10.2 K/uL   Lymph, poc 3.9 (A) 0.6 - 3.4   POC LYMPH PERCENT 28.9 10 - 50 %L   MID (cbc) 0.7 0 - 0.9   POC MID % 4.9 0 - 12 %M   POC Granulocyte 8.9 (A) 2 - 6.9   Granulocyte percent 66.2 37 - 80 %G   RBC 4.48 (A) 4.69 - 6.13 M/uL   Hemoglobin 14.3 14.1 - 18.1 g/dL   HCT, POC 65.740.5 (A) 84.643.5 - 53.7 %   MCV 90.3 80 - 97 fL   MCH, POC 31.9 (A) 27 - 31.2 pg   MCHC 35.3 31.8 - 35.4 g/dL   RDW, POC 96.212.5 %   Platelet Count, POC 238 142 - 424 K/uL   MPV 7.1 0 - 99.8 fL  POCT glucose (manual entry)  Result Value Ref Range   POC Glucose 84 70 - 99 mg/dl        Assessment & Plan:   Fredderick SeveranceGary Reihl is a 45 y.o. male Heartburn - Plan: EKG 12-Lead Chest pain, unspecified chest pain type - Plan: POCT CBC, DG Abd Acute W/Chest  -Possible heartburn, start PPI, trigger avoidance for reflux. ER/911 precautions regarding chest pain discussed.  Flank pain - Plan: DG Abd Acute W/Chest, CT Abdomen Pelvis W  Contrast Abdominal pain, RUQ - Plan: POCT CBC, DG Abd Acute W/Chest, CT Abdomen Pelvis W Contrast Leukocytosis - Plan: CT Abdomen Pelvis W Contrast  - Persistent flank/abdominal pain to the right side now worse past 3 days with some bloating sensation. Afebrile, but with leukocytosis, and reassuring urinalysis, will check CT abdomen pelvis to look into other causes, namely renal stone, gallbladder/liver, less likely pulmonary given no significant cough. ER/RTC precautions  if worse in the meantime.  Urinary frequency - Plan: POCT urinalysis dipstick, POCT Microscopic Urinalysis ([facility]), PSA, POCT glucose (manual entry)  -Reassuring glucose and urinalysis in office, will check PSA.  No orders of the defined types were placed in this encounter.   Patient Instructions       IF you received an x-ray today, you will receive an invoice from [facility]. Please contact [facility] at [phone] with questions or concerns regarding your invoice.   IF you received labwork today, you will receive an invoice from [employer]. Please contact [facility] at [phone] with questions or concerns regarding your invoice.   Our billing staff will not be able to assist you with questions regarding bills from these companies.  You will be contacted with the lab results as soon as they are available. The fastest way to get your results is to activate your My Chart account. Instructions are located on the last page of this paperwork. If you have not heard from [place] regarding the results in 2 weeks, please contact this office.   I did check some other tests including a prostate test, kidney and liver tests, but your white blood cells/infection fighting cells were elevated tonight. I will order a CAT scan for tomorrow (Our staff will call you about the time) to see if there is an infection that needs to be addressed. If the CAT scan is normal, follow-up with me in the next  48 hours to determine the next step. We can repeat your blood count at that time. If you do have fevers, nausea, vomiting, or worsening symptoms, go to the emergency room.   For heartburn, see information below. Avoid fried/fatty foods, spicy foods. Can try omeprazole 20 mg per day over-the-counter once per day. Follow-up with me in the next 2-3 days as long as CT scan is normal.   Food Choices for Gastroesophageal Reflux Disease, Adult When you have gastroesophageal reflux disease (GERD), the foods you eat and your eating habits are very important. Choosing the right foods can help ease the discomfort of GERD. WHAT GENERAL GUIDELINES DO I NEED TO FOLLOW?  Choose fruits, vegetables, whole grains, low-fat dairy products, and low-fat meat, fish, and poultry.  Limit fats such as oils, salad dressings, butter, nuts, and avocado.  Keep a food diary to identify foods that cause symptoms.  Avoid foods that cause reflux. These may be different for different people.  Eat frequent small meals instead of three large meals each day.  Eat your meals slowly, in a relaxed setting.  Limit fried foods.  Cook foods using methods other than frying.  Avoid drinking alcohol.  Avoid drinking large amounts of liquids with your meals.  Avoid bending over or lying down until 2-3 hours after eating. WHAT FOODS ARE NOT RECOMMENDED? The following are some foods and drinks that may worsen your symptoms: Vegetables Tomatoes. Tomato juice. Tomato and spaghetti sauce. Chili peppers. Onion and garlic. Horseradish. Fruits Oranges, grapefruit, and lemon (fruit and juice). Meats High-fat meats, fish, and poultry. This includes hot dogs, ribs, ham, sausage, salami, and bacon. Dairy Whole milk and chocolate milk. Sour cream. Cream. Butter. Ice cream. Cream cheese.  Beverages Coffee and tea, with or without caffeine. Carbonated beverages or energy drinks. Condiments Hot sauce. Barbecue sauce.   Sweets/Desserts Chocolate and cocoa. Donuts. Peppermint and spearmint. Fats and Oils High-fat foods, including [place] fries and potato chips. Other Vinegar. Strong spices, such as black pepper, white pepper, red pepper, cayenne, curry  powder, cloves, ginger, and chili powder. The items listed above may not be a complete list of foods and beverages to avoid. Contact your dietitian for more information.   This information is not intended to replace advice given to you by your health care provider. Make sure you discuss any questions you have with your health care provider.   Document Released: 03/27/2005 Document Revised: 04/17/2014 Document Reviewed: 01/29/2013 Elsevier Interactive Patient Education 2016 Elsevier Inc.   Flank Pain Flank pain refers to pain that is located on the side of the body between the upper abdomen and the back. The pain may occur over a short period of time (acute) or may be long-term or reoccurring (chronic). It may be mild or severe. Flank pain can be caused by many things. CAUSES  Some of the more common causes of flank pain include:  Muscle strains.   Muscle spasms.   A disease of your spine (vertebral disk disease).   A lung infection (pneumonia).   Fluid around your lungs (pulmonary edema).   A kidney infection.   Kidney stones.   A very painful skin rash caused by the chickenpox virus (shingles).   Gallbladder disease.  HOME CARE INSTRUCTIONS  Home care will depend on the cause of your pain. In general,  Rest as directed by your caregiver.  Drink enough fluids to keep your urine clear or pale yellow.  Only take over-the-counter or prescription medicines as directed by your caregiver. Some medicines may help relieve the pain.  Tell your caregiver about any changes in your pain.  Follow up with your caregiver as directed. SEEK IMMEDIATE MEDICAL CARE IF:   Your pain is not controlled with medicine.   You have new or worsening  symptoms.  Your pain increases.   You have abdominal pain.   You have shortness of breath.   You have persistent nausea or vomiting.   You have swelling in your abdomen.   You feel faint or pass out.   You have blood in your urine.  You have a fever or persistent symptoms for more than 2-3 days.  You have a fever and your symptoms suddenly get worse. MAKE SURE YOU:   Understand these instructions.  Will watch your condition.  Will get help right away if you are not doing well or get worse.   This information is not intended to replace advice given to you by your health care provider. Make sure you discuss any questions you have with your health care provider.   Document Released: 05/18/2005 Document Revised: 12/20/2011 Document Reviewed: 11/09/2011 Elsevier Interactive Patient Education 2016 Elsevier Inc.   Abdominal Pain, Adult Many things can cause abdominal pain. Usually, abdominal pain is not caused by a disease and will improve without treatment. It can often be observed and treated at home. Your health care provider will do a physical exam and possibly order blood tests and X-rays to help determine the seriousness of your pain. However, in many cases, more time must pass before a clear cause of the pain can be found. Before that point, your health care provider may not know if you need more testing or further treatment. HOME CARE INSTRUCTIONS Monitor your abdominal pain for any changes. The following actions may help to alleviate any discomfort you are experiencing:  Only take over-the-counter or prescription medicines as directed by your health care provider.  Do not take laxatives unless directed to do so by your health care provider.  Try a clear liquid diet (  broth, tea, or water) as directed by your health care provider. Slowly move to a bland diet as tolerated. SEEK MEDICAL CARE IF:  You have unexplained abdominal pain.  You have abdominal pain  associated with nausea or diarrhea.  You have pain when you urinate or have a bowel movement.  You experience abdominal pain that wakes you in the night.  You have abdominal pain that is worsened or improved by eating food.  You have abdominal pain that is worsened with eating fatty foods.  You have a fever. SEEK IMMEDIATE MEDICAL CARE IF:  Your pain does not go away within 2 hours.  You keep throwing up (vomiting).  Your pain is felt only in portions of the abdomen, such as the right side or the left lower portion of the abdomen.  You pass bloody or black tarry stools. MAKE SURE YOU:  Understand these instructions.  Will watch your condition.  Will get help right away if you are not doing well or get worse.   This information is not intended to replace advice given to you by your health care provider. Make sure you discuss any questions you have with your health care provider.   Document Released: 01/04/2005 Document Revised: 12/16/2014 Document Reviewed: 12/04/2012 Elsevier Interactive Patient Education [employer]     I personally performed the services described in this documentation, which was scribed in my presence. The recorded information has been reviewed and considered, and addended by me as needed.

## 2015-08-03 NOTE — Patient Instructions (Addendum)
IF you received an x-ray today, you will receive an invoice from [facility]. Please contact [facility] at [phone] with questions or concerns regarding your invoice.   IF you received labwork today, you will receive an invoice from [employer]. Please contact [facility] at [phone] with questions or concerns regarding your invoice.   Our billing staff will not be able to assist you with questions regarding bills from these companies.  You will be contacted with the lab results as soon as they are available. The fastest way to get your results is to activate your My Chart account. Instructions are located on the last page of this paperwork. If you have not heard from [place] regarding the results in 2 weeks, please contact this office.   I did check some other tests including a prostate test, kidney and liver tests, but your white blood cells/infection fighting cells were elevated tonight. I will order a CAT scan for tomorrow (Our staff will call you about the time) to see if there is an infection that needs to be addressed. If the CAT scan is normal, follow-up with me in the next 48 hours to determine the next step. We can repeat your blood count at that time. If you do have fevers, nausea, vomiting, or worsening symptoms, go to the emergency room.   For heartburn, see information below. Avoid fried/fatty foods, spicy foods. Can try omeprazole 20 mg per day over-the-counter once per day. Follow-up with me in the next 2-3 days as long as CT scan is normal.   Food Choices for Gastroesophageal Reflux Disease, Adult When you have gastroesophageal reflux disease (GERD), the foods you eat and your eating habits are very important. Choosing the right foods can help ease the discomfort of GERD. WHAT GENERAL GUIDELINES DO I NEED TO FOLLOW?  Choose fruits, vegetables, whole grains, low-fat dairy products, and low-fat meat, fish, and poultry.  Limit fats  such as oils, salad dressings, butter, nuts, and avocado.  Keep a food diary to identify foods that cause symptoms.  Avoid foods that cause reflux. These may be different for different people.  Eat frequent small meals instead of three large meals each day.  Eat your meals slowly, in a relaxed setting.  Limit fried foods.  Cook foods using methods other than frying.  Avoid drinking alcohol.  Avoid drinking large amounts of liquids with your meals.  Avoid bending over or lying down until 2-3 hours after eating. WHAT FOODS ARE NOT RECOMMENDED? The following are some foods and drinks that may worsen your symptoms: Vegetables Tomatoes. Tomato juice. Tomato and spaghetti sauce. Chili peppers. Onion and garlic. Horseradish. Fruits Oranges, grapefruit, and lemon (fruit and juice). Meats High-fat meats, fish, and poultry. This includes hot dogs, ribs, ham, sausage, salami, and bacon. Dairy Whole milk and chocolate milk. Sour cream. Cream. Butter. Ice cream. Cream cheese.  Beverages Coffee and tea, with or without caffeine. Carbonated beverages or energy drinks. Condiments Hot sauce. Barbecue sauce.  Sweets/Desserts Chocolate and cocoa. Donuts. Peppermint and spearmint. Fats and Oils High-fat foods, including [place] fries and potato chips. Other Vinegar. Strong spices, such as black pepper, white pepper, red pepper, cayenne, curry powder, cloves, ginger, and chili powder. The items listed above may not be a complete list of foods and beverages to avoid. Contact your dietitian for more information.   This information is not intended to replace advice given to you by your health care provider. Make sure you discuss any questions  you have with your health care provider.   Document Released: 03/27/2005 Document Revised: 04/17/2014 Document Reviewed: 01/29/2013 Elsevier Interactive Patient Education 2016 Elsevier Inc.   Flank Pain Flank pain refers to pain that is located on the  side of the body between the upper abdomen and the back. The pain may occur over a short period of time (acute) or may be long-term or reoccurring (chronic). It may be mild or severe. Flank pain can be caused by many things. CAUSES  Some of the more common causes of flank pain include:  Muscle strains.   Muscle spasms.   A disease of your spine (vertebral disk disease).   A lung infection (pneumonia).   Fluid around your lungs (pulmonary edema).   A kidney infection.   Kidney stones.   A very painful skin rash caused by the chickenpox virus (shingles).   Gallbladder disease.  HOME CARE INSTRUCTIONS  Home care will depend on the cause of your pain. In general,  Rest as directed by your caregiver.  Drink enough fluids to keep your urine clear or pale yellow.  Only take over-the-counter or prescription medicines as directed by your caregiver. Some medicines may help relieve the pain.  Tell your caregiver about any changes in your pain.  Follow up with your caregiver as directed. SEEK IMMEDIATE MEDICAL CARE IF:   Your pain is not controlled with medicine.   You have new or worsening symptoms.  Your pain increases.   You have abdominal pain.   You have shortness of breath.   You have persistent nausea or vomiting.   You have swelling in your abdomen.   You feel faint or pass out.   You have blood in your urine.  You have a fever or persistent symptoms for more than 2-3 days.  You have a fever and your symptoms suddenly get worse. MAKE SURE YOU:   Understand these instructions.  Will watch your condition.  Will get help right away if you are not doing well or get worse.   This information is not intended to replace advice given to you by your health care provider. Make sure you discuss any questions you have with your health care provider.   Document Released: 05/18/2005 Document Revised: 12/20/2011 Document Reviewed: 11/09/2011 Elsevier  Interactive Patient Education 2016 Elsevier Inc.   Abdominal Pain, Adult Many things can cause abdominal pain. Usually, abdominal pain is not caused by a disease and will improve without treatment. It can often be observed and treated at home. Your health care provider will do a physical exam and possibly order blood tests and X-rays to help determine the seriousness of your pain. However, in many cases, more time must pass before a clear cause of the pain can be found. Before that point, your health care provider may not know if you need more testing or further treatment. HOME CARE INSTRUCTIONS Monitor your abdominal pain for any changes. The following actions may help to alleviate any discomfort you are experiencing:  Only take over-the-counter or prescription medicines as directed by your health care provider.  Do not take laxatives unless directed to do so by your health care provider.  Try a clear liquid diet (broth, tea, or water) as directed by your health care provider. Slowly move to a bland diet as tolerated. SEEK MEDICAL CARE IF:  You have unexplained abdominal pain.  You have abdominal pain associated with nausea or diarrhea.  You have pain when you urinate or have a bowel movement.  You experience abdominal pain that wakes you in the night.  You have abdominal pain that is worsened or improved by eating food.  You have abdominal pain that is worsened with eating fatty foods.  You have a fever. SEEK IMMEDIATE MEDICAL CARE IF:  Your pain does not go away within 2 hours.  You keep throwing up (vomiting).  Your pain is felt only in portions of the abdomen, such as the right side or the left lower portion of the abdomen.  You pass bloody or black tarry stools. MAKE SURE YOU:  Understand these instructions.  Will watch your condition.  Will get help right away if you are not doing well or get worse.   This information is not intended to replace advice given to you  by your health care provider. Make sure you discuss any questions you have with your health care provider.   Document Released: 01/04/2005 Document Revised: 12/16/2014 Document Reviewed: 12/04/2012 Elsevier Interactive Patient Education [employer] Elsevier Inc.

## 2015-08-04 LAB — PSA: PSA: 0.42 ng/mL (ref <=4.00)

## 2015-08-05 ENCOUNTER — Telehealth: Payer: Self-pay | Admitting: Family Medicine

## 2015-08-05 NOTE — Telephone Encounter (Signed)
Called pt with results of CT. No acute findings. Feeling a little better.  Still some discomfort if sitting for awhile. Tender in R side as last visit, but overall better. No fever, no n/v. Has tried omeprazole once yesterday. May be MSK cause and possible radiation from low back as pain with sitting and leaning. Try otc, advil, or alleve, continue omeprazole and recheck in 3 days.  If any fever or worsening sx's sooner - rtc for recheck.

## 2016-09-26 ENCOUNTER — Ambulatory Visit (INDEPENDENT_AMBULATORY_CARE_PROVIDER_SITE_OTHER): Payer: BC Managed Care – PPO | Admitting: Physician Assistant

## 2016-09-26 ENCOUNTER — Encounter: Payer: Self-pay | Admitting: Physician Assistant

## 2016-09-26 VITALS — BP 148/76 | HR 71 | Temp 98.7°F | Resp 16 | Ht 63.0 in | Wt 192.0 lb

## 2016-09-26 DIAGNOSIS — H6120 Impacted cerumen, unspecified ear: Secondary | ICD-10-CM | POA: Diagnosis not present

## 2016-09-26 NOTE — Patient Instructions (Addendum)
  Please make an appointment to see the eye doctor.   IF you received an x-ray today, you will receive an invoice from [facility]. Please contact [facility] at [phone] with questions or concerns regarding your invoice.   IF you received labwork today, you will receive an invoice from [facility]. Please contact [facility] at [phone] with questions or concerns regarding your invoice.   Our billing staff will not be able to assist you with questions regarding bills from these companies.  You will be contacted with the lab results as soon as they are available. The fastest way to get your results is to activate your My Chart account. Instructions are located on the last page of this paperwork. If you have not heard from us regarding the results in 2 weeks, please contact this office.

## 2016-10-01 NOTE — Progress Notes (Signed)
[facility] [address], [place] [phone]  Date:  09/26/2016   Name:  Derek Hopkins   DOB:  07/28/1970   MRN:  [id number]  PCP:  Shade FloodGreene, Jeffrey R, MD    History of Present Illness:  Derek Hopkins is a 46 y.o. male patient who presents to PCP with  Chief Complaint  Patient presents with  . Ear Pain    left and right ear, left is clogged up per pt and he is experiencing left neck and shoulder pain.  Tried to clean with qtip and used ear gtts with no relief.     Patient reports that the left ear has felt increasingly clogged and pain has started to radiate down neck and shoulder.  He has attempted debrox at home to no avail.  He attempted to use a qtip which did not help.  He has no fever, sore throat, or UR symptoms.    There are no active problems to display for this patient.   History reviewed. No pertinent past medical history.  Past Surgical History:  Procedure Laterality Date  . None      Social History  Substance Use Topics  . Smoking status: Never Smoker  . Smokeless tobacco: Never Used  . Alcohol use 0.0 oz/week     Comment: Rare occation    Family History  Problem Relation Age of Onset  . Cancer Mother   . Cancer Father     No Known Allergies  Medication list has been reviewed and updated.  Current Outpatient Prescriptions on File Prior to Visit  Medication Sig Dispense Refill  . Inulin (FIBER CHOICE FRUITY BITES PO) Take by mouth. Reported on 08/03/2015     No current facility-administered medications on file prior to visit.     ROS ROS otherwise unremarkable unless listed above.  Physical Examination: BP (!) 148/76 (BP Location: Left Arm, Patient Position: Sitting, Cuff Size: Large)   Pulse 71   Temp 98.7 F (37.1 C) (Oral)   Resp 16   Ht 5\' 3"  (1.6 m)   Wt 192 lb (87.1 kg)   SpO2 97%   BMI 34.01 kg/m  Ideal Body Weight: Weight in (lb) to have BMI = 25: 140.8  Physical Exam  Constitutional: He is oriented to  person, place, and time. He appears well-developed and well-nourished. No distress.  HENT:  Head: Normocephalic and atraumatic.  Tm visualized and normal following cerumen irrigation.  Initially tm could not well be visualized.   Eyes: Conjunctivae and EOM are normal. Pupils are equal, round, and reactive to light.  Cardiovascular: Normal rate.   Pulmonary/Chest: Effort normal. No respiratory distress.  Neurological: He is alert and oriented to person, place, and time.  Skin: Skin is warm and dry. He is not diaphoretic.  Psychiatric: He has a normal mood and affect. His behavior is normal.     Assessment and Plan: Derek Hopkins is a [age] y.o. male who is here today for cerumen impaction  Impacted cerumen, unspecified laterality  Trena PlattStephanie Evian Salguero, PA-C [facility] 6/24/20182:13 AM

## 2017-01-29 IMAGING — CR DG ABDOMEN ACUTE W/ 1V CHEST
3 series · 3 of 3 positions shown · non-contrast
Comparison: 08/05/2012

CLINICAL DATA: Right-sided flank pain

EXAM:
DG ABDOMEN ACUTE W/ 1V CHEST

[PA]
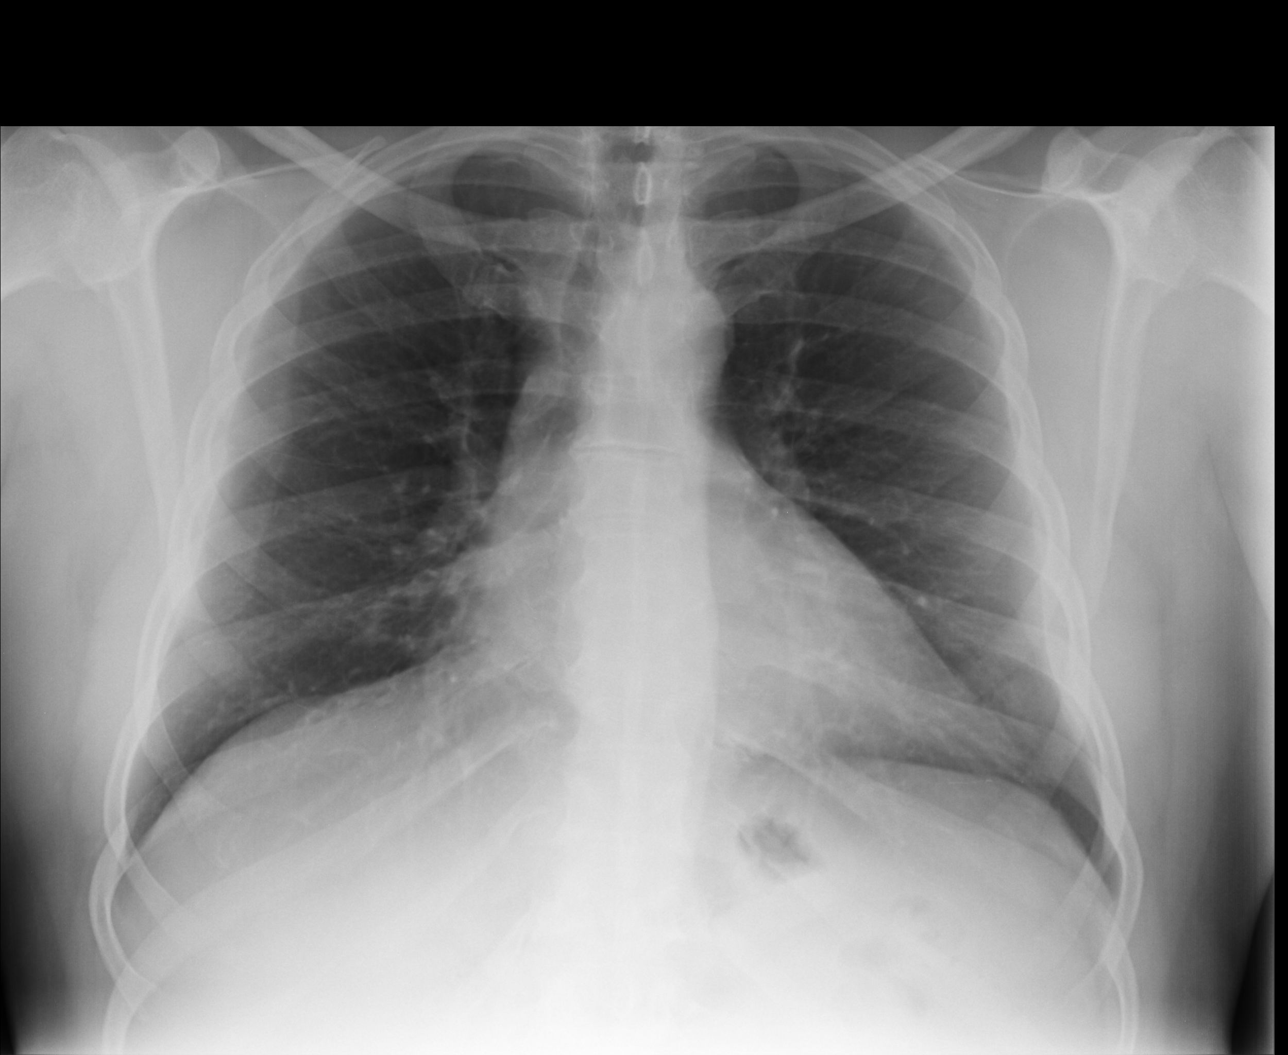

[AP (1 of 2)]
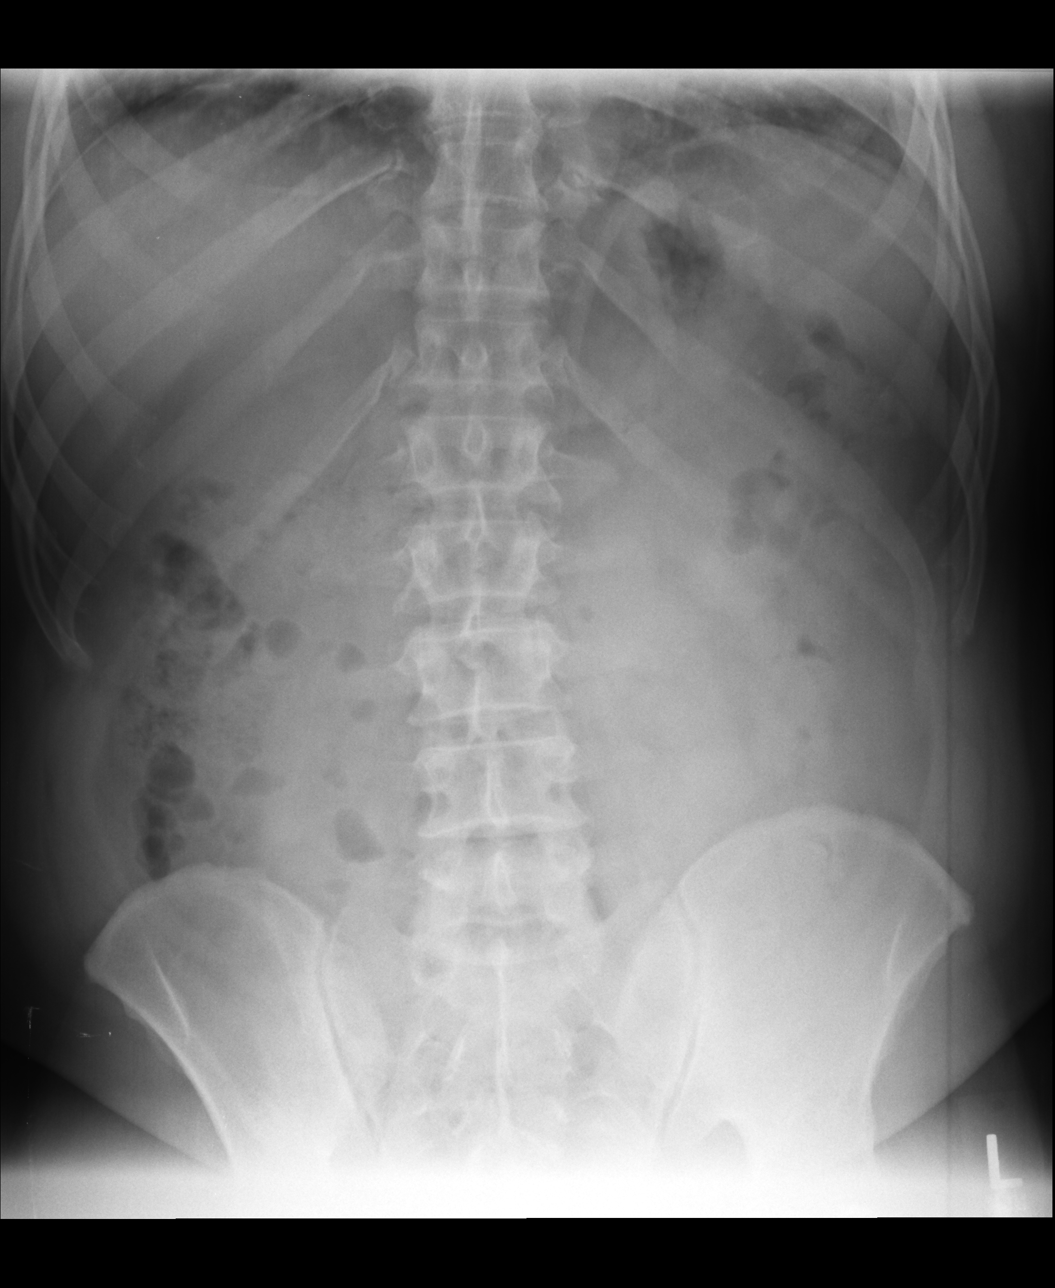

[AP (2 of 2)]
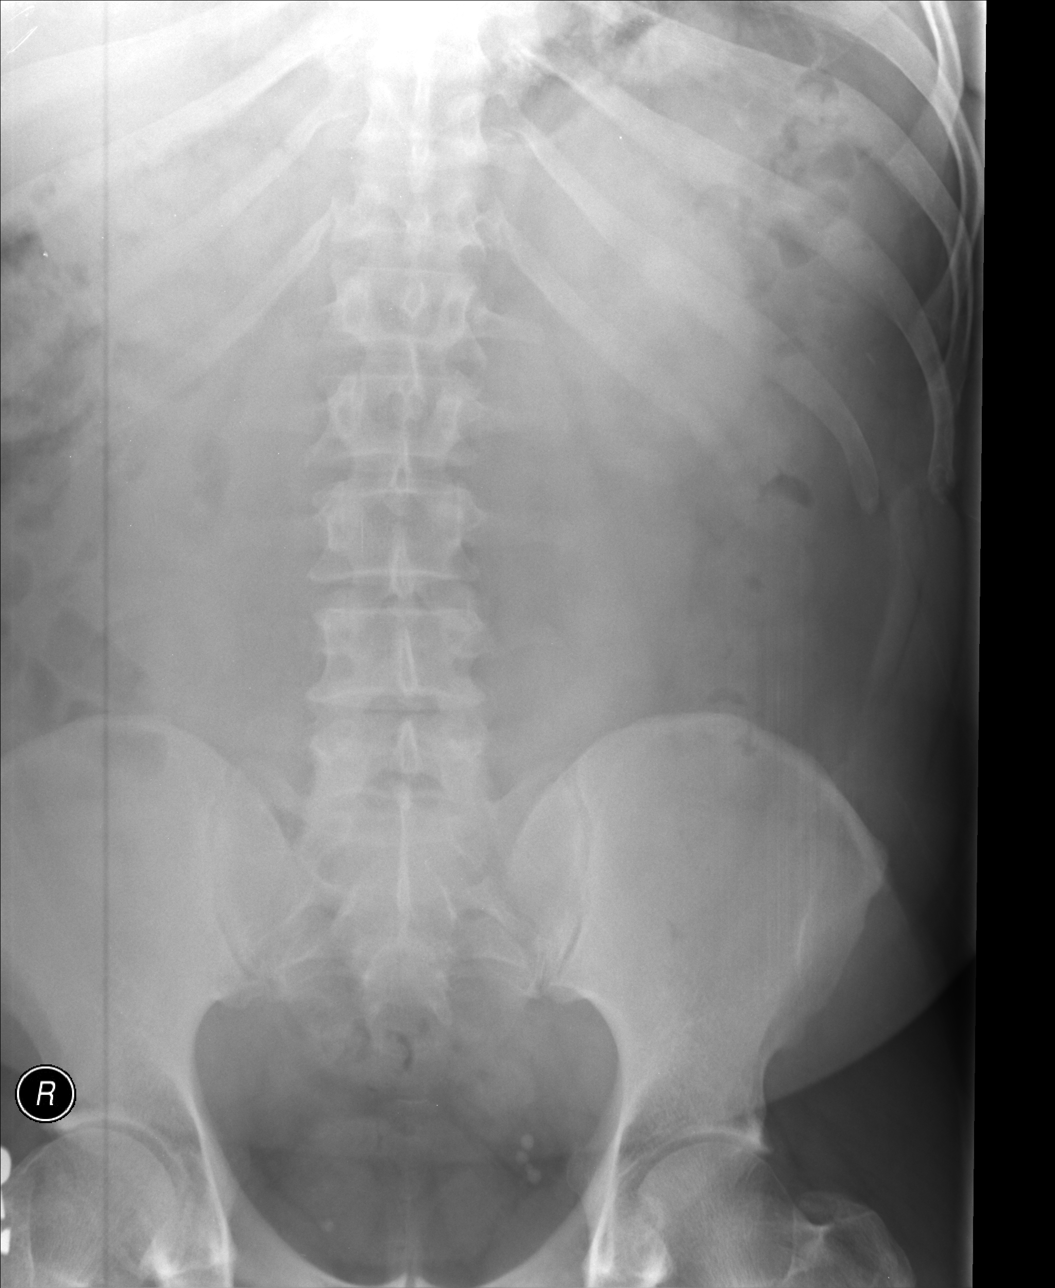

[3 of 3 positions shown; findings below may reference images not displayed]

FINDINGS: The cardiac shadow is stable. The lungs are well aerated
bilaterally. No focal infiltrate or sizable effusion is seen.
Scattered large and small bowel gas is noted. Fecal material is
noted throughout colon consistent with a mild degree of
constipation. No free air is seen. No abnormal mass or abnormal
calcifications are noted. No acute bony abnormality is seen.
IMPRESSION: No acute abnormality noted.

## 2017-07-09 ENCOUNTER — Encounter: Payer: Self-pay | Admitting: Physician Assistant

## 2017-08-14 ENCOUNTER — Other Ambulatory Visit: Payer: Self-pay

## 2017-08-14 ENCOUNTER — Encounter: Payer: Self-pay | Admitting: Family Medicine

## 2017-08-14 ENCOUNTER — Ambulatory Visit (INDEPENDENT_AMBULATORY_CARE_PROVIDER_SITE_OTHER): Payer: BC Managed Care – PPO | Admitting: Family Medicine

## 2017-08-14 ENCOUNTER — Emergency Department (HOSPITAL_COMMUNITY)
Admission: EM | Admit: 2017-08-14 | Discharge: 2017-08-15 | Disposition: A | Discharge status: Home / Self Care | Payer: BC Managed Care – PPO | Attending: Emergency Medicine | Admitting: Emergency Medicine

## 2017-08-14 ENCOUNTER — Encounter (HOSPITAL_COMMUNITY): Payer: Self-pay

## 2017-08-14 VITALS — BP 146/82 | HR 74 | Temp 98.1°F | Ht 63.0 in | Wt 190.6 lb

## 2017-08-14 DIAGNOSIS — R10A1 Flank pain, right side: Secondary | ICD-10-CM

## 2017-08-14 DIAGNOSIS — Z130 Encounter for screening for diseases of the blood and blood-forming organs and certain disorders involving the immune mechanism: Secondary | ICD-10-CM | POA: Diagnosis not present

## 2017-08-14 DIAGNOSIS — R109 Unspecified abdominal pain: Secondary | ICD-10-CM | POA: Diagnosis not present

## 2017-08-14 DIAGNOSIS — Z0000 Encounter for general adult medical examination without abnormal findings: Secondary | ICD-10-CM

## 2017-08-14 DIAGNOSIS — R3915 Urgency of urination: Secondary | ICD-10-CM | POA: Diagnosis not present

## 2017-08-14 DIAGNOSIS — R350 Frequency of micturition: Secondary | ICD-10-CM | POA: Insufficient documentation

## 2017-08-14 DIAGNOSIS — R739 Hyperglycemia, unspecified: Secondary | ICD-10-CM | POA: Diagnosis not present

## 2017-08-14 DIAGNOSIS — Z113 Encounter for screening for infections with a predominantly sexual mode of transmission: Secondary | ICD-10-CM

## 2017-08-14 DIAGNOSIS — H538 Other visual disturbances: Secondary | ICD-10-CM

## 2017-08-14 DIAGNOSIS — Z125 Encounter for screening for malignant neoplasm of prostate: Secondary | ICD-10-CM

## 2017-08-14 DIAGNOSIS — E119 Type 2 diabetes mellitus without complications: Secondary | ICD-10-CM | POA: Diagnosis not present

## 2017-08-14 DIAGNOSIS — N529 Male erectile dysfunction, unspecified: Secondary | ICD-10-CM

## 2017-08-14 DIAGNOSIS — Z131 Encounter for screening for diabetes mellitus: Secondary | ICD-10-CM

## 2017-08-14 DIAGNOSIS — R631 Polydipsia: Secondary | ICD-10-CM | POA: Diagnosis present

## 2017-08-14 LAB — URINALYSIS, ROUTINE W REFLEX MICROSCOPIC
BILIRUBIN URINE: NEGATIVE
Bacteria, UA: NONE SEEN
Glucose, UA: >=500 mg/dL — AB
HGB URINE DIPSTICK: NEGATIVE
Ketones, ur: 5 mg/dL — AB
Leukocytes, UA: NEGATIVE
NITRITE: NEGATIVE
Protein, ur: NEGATIVE mg/dL
SPECIFIC GRAVITY, URINE: 1.03 (ref 1.005–1.030)
pH: 5 (ref 5.0–8.0)

## 2017-08-14 LAB — GLUCOSE, POCT (MANUAL RESULT ENTRY): POC Glucose: >444 mg/dl (ref 70–99)

## 2017-08-14 LAB — CBC
HCT: 40.3 % (ref 39.0–52.0)
Hemoglobin: 14.6 g/dL (ref 13.0–17.0)
MCH: 32 pg (ref 26.0–34.0)
MCHC: 36.2 g/dL — ABNORMAL HIGH (ref 30.0–36.0)
MCV: 88.4 fL (ref 78.0–100.0)
PLATELETS: 251 10*3/uL (ref 150–400)
RBC: 4.56 MIL/uL (ref 4.22–5.81)
RDW: 11.8 % (ref 11.5–15.5)
WBC: 10.8 10*3/uL — ABNORMAL HIGH (ref 4.0–10.5)

## 2017-08-14 LAB — BASIC METABOLIC PANEL
Anion gap: 9 (ref 5–15)
BUN: 20 mg/dL (ref 6–20)
CALCIUM: 9.3 mg/dL (ref 8.9–10.3)
CO2: 24 mmol/L (ref 22–32)
CREATININE: 1.16 mg/dL (ref 0.61–1.24)
Chloride: 100 mmol/L — ABNORMAL LOW (ref 101–111)
Glucose, Bld: 376 mg/dL — ABNORMAL HIGH (ref 65–99)
Potassium: 4.6 mmol/L (ref 3.5–5.1)
SODIUM: 133 mmol/L — AB (ref 135–145)

## 2017-08-14 LAB — POCT URINALYSIS DIP (MANUAL ENTRY)
BILIRUBIN UA: NEGATIVE mg/dL
Bilirubin, UA: NEGATIVE
Leukocytes, UA: NEGATIVE
Nitrite, UA: NEGATIVE
Protein Ur, POC: NEGATIVE mg/dL
RBC UA: NEGATIVE
SPEC GRAV UA: 1.01 (ref 1.010–1.025)
UROBILINOGEN UA: 1 U/dL
pH, UA: 6 (ref 5.0–8.0)

## 2017-08-14 LAB — CBG MONITORING, ED: Glucose-Capillary: 362 mg/dL — ABNORMAL HIGH (ref 65–99)

## 2017-08-14 NOTE — Patient Instructions (Addendum)
Unfortuantely it does appear that you have diabetes, and your blood sugar is very high here today  - too high for our testing to read. That is likely the cause of your blurry vision and urinary symptoms.  Unfortunately at that level, you will need to have testing of your blood sugar in the emergency room tonight as you may need other testing or insulin. I am happy to see you in follow up after ER visit to discuss medications and other treatment for diabetes.   We can check fasting cholesterol at future office visit as that needs to be after fasting for 8 hours. Please return for morning visit so we can also discuss testosterone testing at that time as well as discussing erectile dysfunction symptoms further.   Please call sleep specialist to discuss home sleep test to rule out sleep apnea. [phone]  Keeping you healthy  Get these tests  Blood pressure- Have your blood pressure checked once a year by your healthcare provider.  Normal blood pressure is 120/80.  Weight- Have your body mass index (BMI) calculated to screen for obesity.  BMI is a measure of body fat based on height and weight. You can also calculate your own BMI at https://www.west-esparza.com/.  Cholesterol- Have your cholesterol checked regularly starting at age 50, sooner may be necessary if you have diabetes, high blood pressure, if a family member developed heart diseases at an early age or if you smoke.   Chlamydia, HIV, and other sexual transmitted disease- Get screened each year until the age of 52 then within three months of each new sexual partner.  Diabetes- Have your blood sugar checked regularly if you have high blood pressure, high cholesterol, a family history of diabetes or if you are overweight.  Get these vaccines  Flu shot- Every fall.  Tetanus shot- Every 10 years.  Menactra- Single dose; prevents meningitis.  Take these steps  Don't smoke- If you do smoke, ask your healthcare provider about quitting. For  tips on how to quit, go to www.smokefree.gov or call 1-800-QUIT-NOW.  Be physically active- Exercise 5 days a week for at least 30 minutes.  If you are not already physically active start slow and gradually work up to 30 minutes of moderate physical activity.  Examples of moderate activity include walking briskly, mowing the yard, dancing, swimming bicycling, etc.  Eat a healthy diet- Eat a variety of healthy foods such as fruits, vegetables, low fat milk, low fat cheese, yogurt, lean meats, poultry, fish, beans, tofu, etc.  For more information on healthy eating, go to www.thenutritionsource.org  Drink alcohol in moderation- Limit alcohol intake two drinks or less a day.  Never drink and drive.  Dentist- Brush and floss teeth twice daily; visit your dentis twice a year.  Depression-Your emotional health is as important as your physical health.  If you're feeling down, losing interest in things you normally enjoy please talk with your healthcare provider.  Gun Safety- If you keep a gun in your home, keep it unloaded and with the safety lock on.  Bullets should be stored separately.  Helmet use- Always wear a helmet when riding a motorcycle, bicycle, rollerblading or skateboarding.  Safe sex- If you may be exposed to a sexually transmitted infection, use a condom  Seat belts- Seat bels can save your life; always wear one.  Smoke/Carbon Monoxide detectors- These detectors need to be installed on the appropriate level of your home.  Replace batteries at least once a year.  Skin Cancer-  When out in the sun, cover up and use sunscreen SPF 15 or higher.  Violence- If anyone is threatening or hurting you, please tell your healthcare provider.  IF you received an x-ray today, you will receive an invoice from [facility]. Please contact [facility] at [phone] with questions or concerns regarding your invoice.   IF you received labwork today, you will receive an invoice from  [facility]. Please contact [facility] at [phone] with questions or concerns regarding your invoice.   Our billing staff will not be able to assist you with questions regarding bills from these companies.  You will be contacted with the lab results as soon as they are available. The fastest way to get your results is to activate your My Chart account. Instructions are located on the last page of this paperwork. If you have not heard from [place] regarding the results in 2 weeks, please contact this office.

## 2017-08-14 NOTE — Progress Notes (Signed)
Subjective:  By signing my name below, I, Derek Hopkins, attest that this documentation has been prepared under the direction and in the presence of Derek Staggers, MD. Electronically Signed: Stann Hopkins, Scribe. 08/14/2017 , 6:43 PM .  Patient was seen in Room 2 .   Patient ID: Derek Hopkins, male    DOB: 12/26/1970, 47 y.o.   MRN: [id number] Chief Complaint  Patient presents with  . Annual Exam    CPE  . Urinary Frequency    going more and more urine.Marland Kitchenover a month    . Vision chages    left eye more blurry    HPI  Derek Hopkins is a 47 y.o. male Here for annual physical. Patient's last physical was with Dr. Patsy Lager in 2016. I last saw him in 2017 for acute visit.   He is not fasting today; he had eaten fries about 2 hours ago. He will return for future fasting visit.   Snoring + daytime somnolence On review from his physical in 2016, did discuss referral for a sleep study due to snoring and some daytime somnolence. I don't see where that testing had been performed. He did meet with Dr. Frances Furbish in June 19, 2014. His insurance denied lab study, so home sleep study was planned.   Patient believes he's still probably snoring, and having some daytime somnolence. He describes daytime somnolence just after work, taking a nap, but denies feeling sleepy at work during the day. He notes waking up early in the morning for work, and also drowsiness depends on how he slept the night before.   Wt Readings from Last 3 Encounters:  08/14/17 190 lb 9.6 oz (86.5 kg)  09/26/16 192 lb (87.1 kg)  08/03/15 194 lb (88 kg)    Erectile dysfunction Last discussed with Dr. Patsy Lager in 2016. He was prescribed sildenafil 20mg  at that time.   Patient states he hasn't been taking sildenafil recently. He is sexually active with women. He mentions sometimes having trouble with erections. He's been taking OTC medication, Extenze, for longer duration. He usually has same partner without protection, and sometimes 2  partners.   ROS complaints  Blurry vision: He noticed blurry vision started since Dec'18 and Jan'19. He hasn't seen an eye doctor recently. Initially, he thought it was due to lights affecting his vision.   Urinary frequency: He states this urinary frequency with increased urine ongoing for a couple months now, but noticed even more frequency over the past 3-4 weeks. He reports needing to urinate every 2-3 hours, and prior to worsening, he needed to urinate about every 4 hours or more. He mentions going to the movies on Sunday, urinated before movie, and then had to rush to the bathroom after the movie.   He notes having some flank pain, like a bloating sensation. He denies nausea or vomiting. He does feel some pressure at times, more uncomfortable when sitting, and feels more urgency. He denies hematuria. He denies any known history of kidney stones. When urinating, he's noticed increased urine. He denies fever. He denies history of diabetes.   Lipid screening Lab Results  Component Value Date   CHOL 172 05/18/2014   HDL 39 (L) 05/18/2014   LDLCALC 110 (H) 05/18/2014   TRIG 117 05/18/2014   CHOLHDL 4.4 05/18/2014   STI testing: agrees to testing today. No STI in the past.    Cancer Screening Prostate cancer screening: Denies family history of prostate cancer.  Lab Results  Component Value Date  PSA 0.42 08/03/2015   PSA 0.61 05/18/2014   Family history of cancer Dad - throat cancer, he was a smoker Mom - breast cancer  Patient is a former smoker, quit about 19 years ago.   Immunizations Immunization History  Administered Date(s) Administered  . Tdap 05/18/2014     Depression Depression screen [facility] 2/9 08/14/2017 09/26/2016 08/03/2015 05/18/2014  Decreased Interest 0 0 0 0  Down, Depressed, Hopeless 0 0 0 0  PHQ - 2 Score 0 0 0 0     Vision  Visual Acuity Screening   Right eye Left eye Both eyes  Without correction:  With correction:         Dentist He hasn't seen dentist in about 2 years.    Exercise He denies any regular exercise.   There are no active problems to display for this patient.  No past medical history on file. Past Surgical History:  Procedure Laterality Date  . None     No Known Allergies Prior to Admission medications   Medication Sig Start Date End Date Taking? Authorizing Provider  Inulin (FIBER CHOICE FRUITY BITES PO) Take by mouth. Reported on 08/03/2015    [provider]   Social History   Socioeconomic History  . Marital status: Single    Spouse name: Not on file  . Number of children: 1  . Years of education: BS  . Highest education level: Not on file  Occupational History  . Occupation: [employer]  Social Needs  . Financial resource strain: Not on file  . Food insecurity:    Worry: Not on file    Inability: Not on file  . Transportation needs:    Medical: Not on file    Non-medical: Not on file  Tobacco Use  . Smoking status: Never Smoker  . Smokeless tobacco: Never Used  Substance and Sexual Activity  . Alcohol use: Yes    Alcohol/week: 0.0 oz    Comment: Rare occation  . Drug use: No    Comment: Quit 2002  . Sexual activity: Not on file  Lifestyle  . Physical activity:    Days per week: Not on file    Minutes per session: Not on file  . Stress: Not on file  Relationships  . Social connections:    Talks on phone: Not on file    Gets together: Not on file    Attends religious service: Not on file    Active member of club or organization: Not on file    Attends meetings of clubs or organizations: Not on file    Relationship status: Not on file  . Intimate partner violence:    Fear of current or ex partner: Not on file    Emotionally abused: Not on file    Physically abused: Not on file    Forced sexual activity: Not on file  Other Topics Concern  . Not on file  Social History Narrative  . Not on file     Review of Systems  Eyes:  Positive for photophobia, itching and visual disturbance.  Genitourinary: Positive for frequency and urgency.       Increased urine       Objective:   Physical Exam  Constitutional: He is oriented to person, place, and time. He appears well-developed and well-nourished.  HENT:  Head: Normocephalic and atraumatic.  Right Ear: External ear normal.  Left Ear: External ear normal.  Mouth/Throat: Oropharynx is clear and moist.  Eyes: Pupils are equal, round, and reactive to light. Conjunctivae and EOM are normal.  Neck: Normal range of motion. Neck supple. No thyromegaly present.  Cardiovascular: Normal rate, regular rhythm, normal heart sounds and intact distal pulses.  Pulmonary/Chest: Effort normal and breath sounds normal. No respiratory distress. He has no wheezes.  Abdominal: Soft. Bowel sounds are normal. He exhibits no distension. There is no tenderness. There is no CVA tenderness. Hernia confirmed negative in the right inguinal area and confirmed negative in the left inguinal area.  Genitourinary: Prostate is not tender.  Genitourinary Comments: Slight discomfort on exam, prostate is firm, but denies pain  Musculoskeletal: Normal range of motion. He exhibits no edema or tenderness.  Lymphadenopathy:    He has no cervical adenopathy.  Neurological: He is alert and oriented to person, place, and time. He has normal reflexes.  Skin: Skin is warm and dry.  Psychiatric: He has a normal mood and affect. His behavior is normal.  Vitals reviewed.   Vitals:   08/14/17 1816 08/14/17 1817  BP: (!) 158/80 (!) 146/82  Pulse: 74 74  Temp: 98.1 F (36.7 C)   TempSrc: Oral   SpO2: 99%   Weight: 190 lb 9.6 oz (86.5 kg)   Height:  (1.6 m)        Assessment & Plan:  Daymond Cordts is a 47 y.o. male Annual physical exam  - -anticipatory guidance as below in AVS, screening labs above. Health maintenance items as above in HPI discussed/recommended as applicable.   Blurred vision -  Plan: POCT glucose (manual entry) Hyperglycemia Acute right flank pain Urinary frequency - Plan: POCT urinalysis dipstick, PSA, CANCELED: POCT Microscopic Urinalysis (UMFC) Screening for diabetes mellitus - Plan: POCT glucose (manual entry), Comprehensive metabolic panel, CANCELED: POCT glycosylated hemoglobin (Hb A1C)  -Increasing urinary frequency, blurry vision, along with flank pain/bloating sensation on the right.  Unfortunately unable to assess degree of hyperglycemia as greater than 444 on an office testing.  Due to progressive symptoms, abdominal symptoms and unknown reading and did recommend further evaluation through emergency room tonight.  I suspect he will be able to be treated and discharged with outpatient follow-up and I am happy to see him in the next few days for further diabetes treatment and discussion.  Screening, anemia, deficiency, iron - Plan: CBC  Erectile dysfunction, unspecified erectile dysfunction type  -Plan for further discussion at future office visit where I recommended he have testing between 8 and 10 in the morning for testosterone levels and can discuss medications at that time.  Routine screening for STI (sexually transmitted infection) - Plan: GC/Chlamydia Probe Amp, RPR, HIV antibody Urinary urgency - Plan: PSA, GC/Chlamydia Probe Amp  -Urgency likely due to hyperglycemia as above, but has had unprotected intercourse.  Will check STI testing as above.   No orders of the defined types were placed in this encounter.  Patient Instructions   Unfortuantely it does appear that you have diabetes, and your blood sugar is very high here today  - too high for our testing to read. That is likely the cause of your blurry vision and urinary symptoms.  Unfortunately at that level, you will need to have testing of your blood sugar in the emergency room tonight as you may need other testing or insulin. I am happy to see you in follow up after ER visit to discuss medications  and other treatment for diabetes.   We can check fasting cholesterol at future office visit as that needs  to be after fasting for 8 hours. Please return for morning visit so we can also discuss testosterone testing at that time as well as discussing erectile dysfunction symptoms further.   Please call sleep specialist to discuss home sleep test to rule out sleep apnea. [phone]  Keeping you healthy  Get these tests  Blood pressure- Have your blood pressure checked once a year by your healthcare provider.  Normal blood pressure is 120/80.  Weight- Have your body mass index (BMI) calculated to screen for obesity.  BMI is a measure of body fat based on height and weight. You can also calculate your own BMI at https://www.west-esparza.com/.  Cholesterol- Have your cholesterol checked regularly starting at age 20, sooner may be necessary if you have diabetes, high blood pressure, if a family member developed heart diseases at an early age or if you smoke.   Chlamydia, HIV, and other sexual transmitted disease- Get screened each year until the age of 91 then within three months of each new sexual partner.  Diabetes- Have your blood sugar checked regularly if you have high blood pressure, high cholesterol, a family history of diabetes or if you are overweight.  Get these vaccines  Flu shot- Every fall.  Tetanus shot- Every 10 years.  Menactra- Single dose; prevents meningitis.  Take these steps  Don't smoke- If you do smoke, ask your healthcare provider about quitting. For tips on how to quit, go to www.smokefree.gov or call 1-800-QUIT-NOW.  Be physically active- Exercise 5 days a week for at least 30 minutes.  If you are not already physically active start slow and gradually work up to 30 minutes of moderate physical activity.  Examples of moderate activity include walking briskly, mowing the yard, dancing, swimming bicycling, etc.  Eat a healthy diet- Eat a variety of healthy foods such as  fruits, vegetables, low fat milk, low fat cheese, yogurt, lean meats, poultry, fish, beans, tofu, etc.  For more information on healthy eating, go to www.thenutritionsource.org  Drink alcohol in moderation- Limit alcohol intake two drinks or less a day.  Never drink and drive.  Dentist- Brush and floss teeth twice daily; visit your dentis twice a year.  Depression-Your emotional health is as important as your physical health.  If you're feeling down, losing interest in things you normally enjoy please talk with your healthcare provider.  Gun Safety- If you keep a gun in your home, keep it unloaded and with the safety lock on.  Bullets should be stored separately.  Helmet use- Always wear a helmet when riding a motorcycle, bicycle, rollerblading or skateboarding.  Safe sex- If you may be exposed to a sexually transmitted infection, use a condom  Seat belts- Seat bels can save your life; always wear one.  Smoke/Carbon Monoxide detectors- These detectors need to be installed on the appropriate level of your home.  Replace batteries at least once a year.  Skin Cancer- When out in the sun, cover up and use sunscreen SPF 15 or higher.  Violence- If anyone is threatening or hurting you, please tell your healthcare provider.  IF you received an x-ray today, you will receive an invoice from [facility]. Please contact [facility] at [phone] with questions or concerns regarding your invoice.   IF you received labwork today, you will receive an invoice from [facility]. Please contact [facility] at [phone] with questions or concerns regarding your invoice.   Our billing staff will not be able to assist you with questions regarding bills from these  companies.  You will be contacted with the lab results as soon as they are available. The fastest way to get your results is to activate your My Chart account. Instructions are located on the last page of this paperwork. If you have  not heard from [place] regarding the results in 2 weeks, please contact this office.       I personally performed the services described in this documentation, which was scribed in my presence. The recorded information has been reviewed and considered for accuracy and completeness, addended by me as needed, and agree with information above.  Signed,   Derek Staggers, MD Primary Care at [facility].  08/14/17 7:48 PM

## 2017-08-14 NOTE — ED Triage Notes (Signed)
Pt states that went to see his PCP today due to increased urination, told that his sugar was high, new onset of diabetes. Sent here for evaluation

## 2017-08-15 LAB — COMPREHENSIVE METABOLIC PANEL
A/G RATIO: 1.3 (ref 1.2–2.2)
ALT: 30 IU/L (ref 0–44)
AST: 14 IU/L (ref 0–40)
Albumin: 4.7 g/dL (ref 3.5–5.5)
Alkaline Phosphatase: 110 IU/L (ref 39–117)
BILIRUBIN TOTAL: 0.3 mg/dL (ref 0.0–1.2)
BUN/Creatinine Ratio: 16 (ref 9–20)
BUN: 20 mg/dL (ref 6–24)
CHLORIDE: 95 mmol/L — AB (ref 96–106)
CO2: 22 mmol/L (ref 20–29)
Calcium: 9.9 mg/dL (ref 8.7–10.2)
Creatinine, Ser: 1.22 mg/dL (ref 0.76–1.27)
GFR calc Af Amer: 81 mL/min/{1.73_m2} (ref >59)
GFR calc non Af Amer: 70 mL/min/{1.73_m2} (ref >59)
Globulin, Total: 3.7 g/dL (ref 1.5–4.5)
Glucose: 440 mg/dL — ABNORMAL HIGH (ref 65–99)
POTASSIUM: 4.5 mmol/L (ref 3.5–5.2)
Sodium: 134 mmol/L (ref 134–144)
Total Protein: 8.4 g/dL (ref 6.0–8.5)

## 2017-08-15 LAB — RPR: RPR Ser Ql: NONREACTIVE

## 2017-08-15 LAB — CBC
Hematocrit: 44.3 % (ref 37.5–51.0)
Hemoglobin: 15.2 g/dL (ref 13.0–17.7)
MCH: 31.4 pg (ref 26.6–33.0)
MCHC: 34.3 g/dL (ref 31.5–35.7)
MCV: 92 fL (ref 79–97)
PLATELETS: 260 10*3/uL (ref 150–379)
RBC: 4.84 x10E6/uL (ref 4.14–5.80)
RDW: 12.9 % (ref 12.3–15.4)
WBC: 10.3 10*3/uL (ref 3.4–10.8)

## 2017-08-15 LAB — CBG MONITORING, ED: GLUCOSE-CAPILLARY: 311 mg/dL — AB (ref 65–99)

## 2017-08-15 LAB — HIV ANTIBODY (ROUTINE TESTING W REFLEX): HIV Screen 4th Generation wRfx: NONREACTIVE

## 2017-08-15 LAB — GC/CHLAMYDIA PROBE AMP
CHLAMYDIA, DNA PROBE: NEGATIVE
NEISSERIA GONORRHOEAE BY PCR: NEGATIVE

## 2017-08-15 MED ORDER — METFORMIN HCL 500 MG PO TABS: 500.0000 mg | ORAL_TABLET | Freq: Two times a day (BID) | ORAL | 1 refills | Status: DC

## 2017-08-15 MED ORDER — METFORMIN HCL 500 MG PO TABS
500.0000 mg | ORAL_TABLET | Freq: Once | ORAL | Status: AC
  Administered 2017-08-15: 500 mg via ORAL
  Filled 2017-08-15: qty 1

## 2017-08-15 NOTE — Discharge Instructions (Signed)
As we discussed, your labs today do confirm new diabetes. We are starting you on metformin to help control your blood sugar.  Likely talked about, this can cause some GI upset. Follow-up closely with your doctor.  Hopefully he can enroll you in a diabetic education class.  I have attached some information that can help you as well. Return to the ED for any new or worsening symptoms.

## 2017-08-15 NOTE — ED Notes (Signed)
CBG collected. Result was 311.  Notified Marcie Bal, Charity fundraiser.

## 2017-08-15 NOTE — ED Provider Notes (Signed)
[facility] EMERGENCY DEPARTMENT Provider Note   CSN: [id number] Arrival date & time: 08/14/17  2018     History   Chief Complaint Chief Complaint  Patient presents with  . Hyperglycemia    HPI Derek Hopkins is a 47 y.o. male.  The history is provided by the patient and medical records.  Hyperglycemia  Associated symptoms: increased thirst and polyuria      47 year old male presenting to the ED at encouragement of PCP for further work-up of new onset diabetes.  States he initially went to the doctor yesterday due to frequent urination and thirst.  States intermittently over the past several weeks he has had some episodes of blurred vision and abdominal pain and bloating.  He has not had any dysuria, fever, or chills.  He denies any current abdominal pain.  States he has never been told he had diabetes or borderline diabetes in the past.  States overall his diet could stand to improve, he goes through periods where he eats lots of junk food and drink soda.  He has been trying to drink more water recently.  He is unsure of any other past medical history.  History reviewed. No pertinent past medical history.  There are no active problems to display for this patient.   Past Surgical History:  Procedure Laterality Date  . None          Home Medications    Prior to Admission medications   Medication Sig Start Date End Date Taking? Authorizing Provider  Inulin (FIBER CHOICE FRUITY BITES PO) Take by mouth. Reported on 08/03/2015    [provider]    Family History Family History  Problem Relation Age of Onset  . Cancer Mother   . Cancer Father     Social History Social History   Tobacco Use  . Smoking status: Never Smoker  . Smokeless tobacco: Never Used  Substance Use Topics  . Alcohol use: Yes    Alcohol/week: 0.0 oz    Comment: Rare occation  . Drug use: No    Comment: Quit 2002     Allergies   Patient has no known  allergies.   Review of Systems Review of Systems  Endocrine: Positive for polydipsia and polyuria.  All other systems reviewed and are negative.    Physical Exam Updated Vital Signs BP (!) 154/89 (BP Location: Right Arm)   Pulse 66   Temp 98.4 F (36.9 C) (Oral)   Resp 17   Ht  (1.6 m)   Wt 86.2 kg (190 lb)   SpO2 95%   BMI 33.66 kg/m   Physical Exam  Constitutional: He is oriented to person, place, and time. He appears well-developed and well-nourished.  HENT:  Head: Normocephalic and atraumatic.  Mouth/Throat: Oropharynx is clear and moist.  Eyes: Pupils are equal, round, and reactive to light. Conjunctivae and EOM are normal.  Neck: Normal range of motion.  Cardiovascular: Normal rate, regular rhythm and normal heart sounds.  Pulmonary/Chest: Effort normal and breath sounds normal. No stridor. No respiratory distress.  Abdominal: Soft. Bowel sounds are normal. There is no tenderness. There is no rebound and no CVA tenderness.  Soft, nontender, no CVA tenderness  Musculoskeletal: Normal range of motion.  Neurological: He is alert and oriented to person, place, and time.  Skin: Skin is warm and dry.  Psychiatric: He has a normal mood and affect.  Nursing note and vitals reviewed.    ED Treatments / Results  Labs (  all labs ordered are listed, but only abnormal results are displayed) Labs Reviewed  BASIC METABOLIC PANEL - Abnormal; Notable for the following components:      Result Value   Sodium 133 (*)    Chloride 100 (*)    Glucose, Bld 376 (*)    All other components within normal limits  CBC - Abnormal; Notable for the following components:   WBC 10.8 (*)    MCHC 36.2 (*)    All other components within normal limits  URINALYSIS, ROUTINE W REFLEX MICROSCOPIC - Abnormal; Notable for the following components:   Color, Urine STRAW (*)    Glucose, UA >=500 (*)    Ketones, ur 5 (*)    All other components within normal limits  CBG MONITORING, ED -  Abnormal; Notable for the following components:   Glucose-Capillary 362 (*)    All other components within normal limits  CBG MONITORING, ED    EKG None  Radiology No results found.  Procedures Procedures (including critical care time)  Medications Ordered in ED Medications  metFORMIN (GLUCOPHAGE) tablet 500 mg (has no administration in time range)     Initial Impression / Assessment and Plan / ED Course  I have reviewed the triage vital signs and the nursing notes.  Pertinent labs & imaging results that were available during my care of the patient were reviewed by me and considered in my medical decision making (see chart for details).  47 year old male presenting to the ED from PCP for evaluation of new onset diabetes.  He was seen there due to polyuria and polydipsia, had a fingerstick glucose of over 400.  Overall here he appears well, vitals are stable.  He does not appear clinically dehydrated.  He is tolerating oral fluids well at this time.  Labs were obtained and are consistent with diabetes, however no evidence of DKA.  His renal function remains within normal limits.  Discussed with patient that we will need to initiate therapy with metformin.  We went over potential side effects of this including GI upset.  We have also gone over healthy eating, limiting sugar and carbs.  He was given information on this as well.  He will need close follow-up with his primary care doctor.  May benefit from a diabetic education class.  Discussed plan with patient, he acknowledged understanding and agreed with plan of care.  Return precautions given for new or worsening symptoms.  Final Clinical Impressions(s) / ED Diagnoses   Final diagnoses:  Type 2 diabetes mellitus without complication, without long-term current use of insulin [facility])    ED Discharge Orders        Ordered    metFORMIN (GLUCOPHAGE) 500 MG tablet  2 times daily with meals     08/15/17 0135       Garlon Hatchet,  PA-C 08/15/17 4098    Azalia Bilis, MD 08/15/17 [phone]

## 2017-08-15 NOTE — ED Notes (Signed)
ED Provider at bedside. 

## 2017-08-16 ENCOUNTER — Ambulatory Visit: Payer: BC Managed Care – PPO | Admitting: Family Medicine

## 2017-08-16 ENCOUNTER — Encounter: Payer: Self-pay | Admitting: Family Medicine

## 2017-08-16 ENCOUNTER — Other Ambulatory Visit: Payer: Self-pay

## 2017-08-16 VITALS — BP 128/90 | HR 68 | Temp 98.3°F | Ht 63.0 in | Wt 188.2 lb

## 2017-08-16 DIAGNOSIS — H538 Other visual disturbances: Secondary | ICD-10-CM | POA: Diagnosis not present

## 2017-08-16 DIAGNOSIS — R350 Frequency of micturition: Secondary | ICD-10-CM

## 2017-08-16 DIAGNOSIS — E1165 Type 2 diabetes mellitus with hyperglycemia: Secondary | ICD-10-CM

## 2017-08-16 LAB — POCT GLYCOSYLATED HEMOGLOBIN (HGB A1C): HEMOGLOBIN A1C: 10.7

## 2017-08-16 LAB — GLUCOSE, POCT (MANUAL RESULT ENTRY): POC Glucose: 223 mg/dl — AB (ref 70–99)

## 2017-08-16 LAB — SPECIMEN STATUS REPORT

## 2017-08-16 LAB — PSA: Prostate Specific Ag, Serum: 0.5 ng/mL (ref 0.0–4.0)

## 2017-08-16 MED ORDER — BLOOD GLUCOSE METER KIT: PACK | 0 refills | Status: DC

## 2017-08-16 NOTE — Patient Instructions (Addendum)
I expect vision and urinary symptoms to improve, but I will refer you to ophthalmologist for ongoing care.   Check blood sugar twice per day and keep record for follow up in 2 weeks.   Metfomin twice per day for now.   I will refer you to diabetes specialist.   Thank you for coming in today.    Type 2 Diabetes Mellitus, Self Care, Adult When you have type 2 diabetes (type 2 diabetes mellitus), you must keep your blood sugar (glucose) under control. You can do this with:  Nutrition.  Exercise.  Lifestyle changes.  Medicines or insulin, if needed.  Support from your doctors and others.  How do I manage my blood sugar?  Check your blood sugar level every day, as often as told.  Call your doctor if your blood sugar is above your goal numbers for 2 tests in a row.  Have your A1c (hemoglobin A1c) level checked at least two times a year. Have it checked more often if your doctor tells you to. Your doctor will set treatment goals for you. Generally, you should have these blood sugar levels:  Before meals (preprandial): 80-130 mg/dL (4.4-7.2 mmol/L).  After meals (postprandial): lower than 180 mg/dL (10 mmol/L).  A1c level: less than 7%.  What do I need to know about high blood sugar? High blood sugar is called hyperglycemia. Know the signs of high blood sugar. Signs may include:  Feeling: ? Thirsty. ? Hungry. ? Very tired.  Needing to pee (urinate) more than usual.  Blurry vision.  What do I need to know about low blood sugar? Low blood sugar is called hypoglycemia. This is when blood sugar is at or below 70 mg/dL (3.9 mmol/L). Symptoms may include:  Feeling: ? Hungry. ? Worried or nervous (anxious). ? Sweaty and clammy. ? Confused. ? Dizzy. ? Sleepy. ? Sick to your stomach (nauseous).  Having: ? A fast heartbeat (palpitations). ? A headache. ? A change in your vision. ? Jerky movements that you cannot control (seizure). ? Nightmares. ? Tingling or no  feeling (numbness) around the mouth, lips, or tongue.  Having trouble with: ? Talking. ? Paying attention (concentrating). ? Moving (coordination). ? Sleeping.  Shaking.  Passing out (fainting).  Getting upset easily (irritability).  Treating low blood sugar  To treat low blood sugar, eat or drink something sugary right away. If you can think clearly and swallow safely, follow the 15:15 rule:  Take 15 grams of a fast-acting carb (carbohydrate). Some fast-acting carbs are: ? 1 tube of glucose gel. ? 3 sugar tablets (glucose pills). ? 6-8 pieces of hard candy. ? 4 oz (120 mL) of fruit juice. ? 4 oz (120 mL) regular (not diet) soda.  Check your blood sugar 15 minutes after you take the carb.  If your blood sugar is still at or below 70 mg/dL (3.9 mmol/L), take 15 grams of a carb again.  If your blood sugar does not go above 70 mg/dL (3.9 mmol/L) after 3 tries, get help right away.  After your blood sugar goes back to normal, eat a meal or a snack within 1 hour.  Treating very low blood sugar If your blood sugar is at or below 54 mg/dL (3 mmol/L), you have very low blood sugar (severe hypoglycemia). This is an emergency. Do not wait to see if the symptoms will go away. Get medical help right away. Call your local emergency services (911 in the [place] Do not drive yourself to the hospital. If  you have very low blood sugar and you cannot eat or drink, you may need a glucagon shot (injection). A family member or friend should learn how to check your blood sugar and how to give you a glucagon shot. Ask your doctor if you need to have a glucagon shot kit at home. What else is important to manage my diabetes? Medicine Follow these instructions about insulin and diabetes medicines:  Take them as told by your doctor.  Adjust them as told by your doctor.  Do not run out of them.  Having diabetes can raise your risk for other long-term conditions. These include heart or kidney  disease. Your doctor may prescribe medicines to help prevent problems from diabetes. Food   Make healthy food choices. These include: ? Chicken, fish, egg whites, and beans. ? Oats, whole wheat, bulgur, brown rice, quinoa, and millet. ? Fresh fruits and vegetables. ? Low-fat dairy products. ? Nuts, avocado, olive oil, and canola oil.  Make a food plan with a specialist (dietitian).  Follow instructions from your doctor about what you cannot eat or drink.  Drink enough fluid to keep your pee (urine) clear or pale yellow.  Eat healthy snacks between healthy meals.  Keep track of carbs that you eat. Read food labels. Learn food serving sizes.  Follow your sick day plan when you cannot eat or drink normally. Make this plan with your doctor so it is ready to use. Activity  Exercise at least 3 times a week.  Do not go more than 2 days without exercising.  Talk with your doctor before you start a new exercise. Your doctor may need to adjust your insulin, medicines, or food. Lifestyle   Do not use any tobacco products. These include cigarettes, chewing tobacco, and e-cigarettes.If you need help quitting, ask your doctor.  Ask your doctor how much alcohol is safe for you.  Learn to deal with stress. If you need help with this, ask your doctor. Body care  Stay up to date with your shots (immunizations).  Have your eyes and feet checked by a doctor as often as told.  Check your skin and feet every day. Check for cuts, bruises, redness, blisters, or sores.  Brush your teeth and gums two times a day.  Floss at least one time a day.  Go to the dentist least one time every 6 months.  Stay at a healthy weight. General instructions   Take over-the-counter and prescription medicines only as told by your doctor.  Share your diabetes care plan with: ? Your work or school. ? People you live with.  Check your pee (urine) for ketones: ? When you are sick. ? As told by your  doctor.  Carry a card or wear jewelry that says that you have diabetes.  Ask your doctor: ? Do I need to meet with a diabetes educator? ? Where can I find a support group for people with diabetes?  Keep all follow-up visits as told by your doctor. This is important. Where to find more information: To learn more about diabetes, visit:  American Diabetes Association: www.diabetes.org  American Association of Diabetes Educators: www.diabeteseducator.org/patient-resources  This information is not intended to replace advice given to you by your health care provider. Make sure you discuss any questions you have with your health care provider. Document Released: 07/19/2015 Document Revised: 09/02/2015 Document Reviewed: 04/30/2015 Elsevier Interactive Patient Education  2018 [employer].     IF you received an x-ray today, you will receive  an invoice from [facility]. Please contact [facility] at [phone] with questions or concerns regarding your invoice.   IF you received labwork today, you will receive an invoice from [facility]. Please contact [facility] at [phone] with questions or concerns regarding your invoice.   Our billing staff will not be able to assist you with questions regarding bills from these companies.  You will be contacted with the lab results as soon as they are available. The fastest way to get your results is to activate your My Chart account. Instructions are located on the last page of this paperwork. If you have not heard from us regarding the results in 2 weeks, please contact this office.      

## 2017-08-16 NOTE — Progress Notes (Signed)
Subjective:  By signing my name below, I, [name], attest that this documentation has been prepared under the direction and in the presence of [name], MD Electronically Signed: Ladene Artist, ED Scribe 08/16/2017 at 12:11 PM.   Patient ID: Derek Hopkins, male    DOB: [birth year], 47 y.o.   MRN: [id number]  Chief Complaint  Patient presents with  . Diabetes    x2 day follow up   HPI Derek Hopkins is a 47 y.o. male who presents to [facility] at [facility] for f/u. Pt was seen 2 days ago for CPE and multiple concerns. He was diagnosed with DM at that visit with frequent urination, blurry vison, episodic R flank pain. Glucose was too high to read in the office and was sent to ER for further testing. Luckily he was not showing signs of DKA at that time. Normal CO2, glucose was 376 in the ER, no ketones in the urine. Pt was started on Metformin 500 mg bid. He is here for f/u.  Pt states that he still has intermittent R flank pain which is less severe. Also states urinary frequency and urinary urgency has improved significantly over the past 2 days. He has also noticed that blurry vision has begin to improve; pt does not recall the last time he saw an eye doctor. He has only taken 2 doses of metformin so far but has not noticed nausea, bloating, vomiting, diarrhea. He didn't take a dose of meds this morning due to fasting. Pt has not checked his blood glucose since leaving the ER.  There are no active problems to display for this patient.  No past medical history on file. Past Surgical History:  Procedure Laterality Date  . None     No Known Allergies Prior to Admission medications   Medication Sig Start Date End Date Taking? Authorizing Provider  metFORMIN (GLUCOPHAGE) 500 MG tablet Take 1 tablet (500 mg total) by mouth 2 (two) times daily with a meal. 08/15/17   [name], PA-C   Social History   Socioeconomic History  . Marital status: Single    Spouse name: Not on file  .  Number of children: 1  . Years of education: BS  . Highest education level: Not on file  Occupational History  . Occupation: [employer]  . Financial resource strain: Not on file  . Food insecurity:    Worry: Not on file    Inability: Not on file  . Transportation needs:    Medical: Not on file    Non-medical: Not on file  Tobacco Use  . Smoking status: Never Smoker  . Smokeless tobacco: Never Used  Substance and Sexual Activity  . Alcohol use: Yes    Alcohol/week: 0.0 oz    Comment: Rare occation  . Drug use: No    Comment: Quit 2002  . Sexual activity: Not on file  Lifestyle  . Physical activity:    Days per week: Not on file    Minutes per session: Not on file  . Stress: Not on file  Relationships  . Social connections:    Talks on phone: Not on file    Gets together: Not on file    Attends religious service: Not on file    Active member of club or organization: Not on file    Attends meetings of clubs or organizations: Not on file    Relationship status: Not on file  . Intimate partner violence:  Fear of current or ex partner: Not on file    Emotionally abused: Not on file    Physically abused: Not on file    Forced sexual activity: Not on file  Other Topics Concern  . Not on file  Social History Narrative  . Not on file   Review of Systems  Eyes: Positive for visual disturbance (improving).  Gastrointestinal: Negative for abdominal distention, diarrhea, nausea and vomiting.  Genitourinary: Positive for flank pain (improving), frequency (improving) and urgency (improving).      Objective:   Physical Exam  Constitutional: He is oriented to person, place, and time. He appears well-developed and well-nourished.  HENT:  Head: Normocephalic and atraumatic.  Eyes: Pupils are equal, round, and reactive to light. EOM are normal.  Neck: No JVD present. Carotid bruit is not present.  Cardiovascular: Normal rate, regular rhythm and normal  heart sounds.  No murmur heard. Pulmonary/Chest: Effort normal and breath sounds normal. He has no rales.  Musculoskeletal: He exhibits no edema.  Neurological: He is alert and oriented to person, place, and time.  Skin: Skin is warm and dry.  Psychiatric: He has a normal mood and affect.  Vitals reviewed.  Vitals:   08/16/17 1145 08/16/17 1212  BP: (!) 150/76 128/90  Pulse: 68   Temp: 98.3 F (36.8 C)   TempSrc: Oral   SpO2: 99%   Weight: 188 lb 3.2 oz (85.4 kg)   Height: '5\' 3"'$  (1.6 m)       Assessment & Plan:    Derek Hopkins is a 47 y.o. male Type 2 diabetes mellitus with hyperglycemia, without long-term current use of insulin ([place]) - Plan: POCT glycosylated hemoglobin (Hb A1C), POCT glucose (manual entry), blood glucose meter kit and supplies, Ambulatory referral to diabetic education, Microalbumin, urine, Ambulatory referral to Ophthalmology  - new diagnosis. Tolerating metformin, BID for now.   - handout on self care with DM, rx meter, check home readings, refer to classes. Recheck 2 weeks.   Blurred vision - Plan: Ambulatory referral to Ophthalmology Urinary frequency - Plan: POCT glycosylated hemoglobin (Hb A1C), POCT glucose (manual entry)  - both should improve as diabetes treated. appt with ortho for eval as well.   Meds ordered this encounter  Medications  . blood glucose meter kit and supplies    Sig: Dispense based on patient and insurance preference. Check 2 times per day.    Dispense:  1 each    Refill:  0    Order Specific Question:   Number of strips    Answer:   100    Order Specific Question:   Number of lancets    Answer:   100   Patient Instructions    I expect vision and urinary symptoms to improve, but I will refer you to ophthalmologist for ongoing care.   Check blood sugar twice per day and keep record for follow up in 2 weeks.   Metfomin twice per day for now.   I will refer you to diabetes specialist.   Thank you for coming in today.     Type 2 Diabetes Mellitus, Self Care, Adult When you have type 2 diabetes (type 2 diabetes mellitus), you must keep your blood sugar (glucose) under control. You can do this with:  Nutrition.  Exercise.  Lifestyle changes.  Medicines or insulin, if needed.  Support from your doctors and others.  How do I manage my blood sugar?  Check your blood sugar level every day, as often as  told.  Call your doctor if your blood sugar is above your goal numbers for 2 tests in a row.  Have your A1c (hemoglobin A1c) level checked at least two times a year. Have it checked more often if your doctor tells you to. Your doctor will set treatment goals for you. Generally, you should have these blood sugar levels:  Before meals (preprandial): 80-130 mg/dL (4.4-7.2 mmol/L).  After meals (postprandial): lower than 180 mg/dL (10 mmol/L).  A1c level: less than 7%.  What do I need to know about high blood sugar? High blood sugar is called hyperglycemia. Know the signs of high blood sugar. Signs may include:  Feeling: ? Thirsty. ? Hungry. ? Very tired.  Needing to pee (urinate) more than usual.  Blurry vision.  What do I need to know about low blood sugar? Low blood sugar is called hypoglycemia. This is when blood sugar is at or below 70 mg/dL (3.9 mmol/L). Symptoms may include:  Feeling: ? Hungry. ? Worried or nervous (anxious). ? Sweaty and clammy. ? Confused. ? Dizzy. ? Sleepy. ? Sick to your stomach (nauseous).  Having: ? A fast heartbeat (palpitations). ? A headache. ? A change in your vision. ? Jerky movements that you cannot control (seizure). ? Nightmares. ? Tingling or no feeling (numbness) around the mouth, lips, or tongue.  Having trouble with: ? Talking. ? Paying attention (concentrating). ? Moving (coordination). ? Sleeping.  Shaking.  Passing out (fainting).  Getting upset easily (irritability).  Treating low blood sugar  To treat low blood sugar, eat  or drink something sugary right away. If you can think clearly and swallow safely, follow the 15:15 rule:  Take 15 grams of a fast-acting carb (carbohydrate). Some fast-acting carbs are: ? 1 tube of glucose gel. ? 3 sugar tablets (glucose pills). ? 6-8 pieces of hard candy. ? 4 oz (120 mL) of fruit juice. ? 4 oz (120 mL) regular (not diet) soda.  Check your blood sugar 15 minutes after you take the carb.  If your blood sugar is still at or below 70 mg/dL (3.9 mmol/L), take 15 grams of a carb again.  If your blood sugar does not go above 70 mg/dL (3.9 mmol/L) after 3 tries, get help right away.  After your blood sugar goes back to normal, eat a meal or a snack within 1 hour.  Treating very low blood sugar If your blood sugar is at or below 54 mg/dL (3 mmol/L), you have very low blood sugar (severe hypoglycemia). This is an emergency. Do not wait to see if the symptoms will go away. Get medical help right away. Call your local emergency services (911 in the [place] Do not drive yourself to the hospital. If you have very low blood sugar and you cannot eat or drink, you may need a glucagon shot (injection). A family member or friend should learn how to check your blood sugar and how to give you a glucagon shot. Ask your doctor if you need to have a glucagon shot kit at home. What else is important to manage my diabetes? Medicine Follow these instructions about insulin and diabetes medicines:  Take them as told by your doctor.  Adjust them as told by your doctor.  Do not run out of them.  Having diabetes can raise your risk for other long-term conditions. These include heart or kidney disease. Your doctor may prescribe medicines to help prevent problems from diabetes. Food   Make healthy food choices. These include: ? Chicken,  fish, egg whites, and beans. ? Oats, whole wheat, bulgur, brown rice, quinoa, and millet. ? Fresh fruits and vegetables. ? Low-fat dairy products. ? Nuts,  avocado, olive oil, and canola oil.  Make a food plan with a specialist (dietitian).  Follow instructions from your doctor about what you cannot eat or drink.  Drink enough fluid to keep your pee (urine) clear or pale yellow.  Eat healthy snacks between healthy meals.  Keep track of carbs that you eat. Read food labels. Learn food serving sizes.  Follow your sick day plan when you cannot eat or drink normally. Make this plan with your doctor so it is ready to use. Activity  Exercise at least 3 times a week.  Do not go more than 2 days without exercising.  Talk with your doctor before you start a new exercise. Your doctor may need to adjust your insulin, medicines, or food. Lifestyle   Do not use any tobacco products. These include cigarettes, chewing tobacco, and e-cigarettes.If you need help quitting, ask your doctor.  Ask your doctor how much alcohol is safe for you.  Learn to deal with stress. If you need help with this, ask your doctor. Body care  Stay up to date with your shots (immunizations).  Have your eyes and feet checked by a doctor as often as told.  Check your skin and feet every day. Check for cuts, bruises, redness, blisters, or sores.  Brush your teeth and gums two times a day.  Floss at least one time a day.  Go to the dentist least one time every 6 months.  Stay at a healthy weight. General instructions   Take over-the-counter and prescription medicines only as told by your doctor.  Share your diabetes care plan with: ? Your work or school. ? People you live with.  Check your pee (urine) for ketones: ? When you are sick. ? As told by your doctor.  Carry a card or wear jewelry that says that you have diabetes.  Ask your doctor: ? Do I need to meet with a diabetes educator? ? Where can I find a support group for people with diabetes?  Keep all follow-up visits as told by your doctor. This is important. Where to find more information: To  learn more about diabetes, visit:  American Diabetes Association: www.diabetes.org  American Association of Diabetes Educators: www.diabeteseducator.org/patient-resources  This information is not intended to replace advice given to you by your health care provider. Make sure you discuss any questions you have with your health care provider. Document Released: 07/19/2015 Document Revised: 09/02/2015 Document Reviewed: 04/30/2015 Elsevier Interactive Patient Education  2018 [employer].     IF you received an x-ray today, you will receive an invoice from [facility] Radiology. Please contact [facility] at [phone] with questions or concerns regarding your invoice.   IF you received labwork today, you will receive an invoice from [facility]. Please contact [facility] at [phone] with questions or concerns regarding your invoice.   Our billing staff will not be able to assist you with questions regarding bills from these companies.  You will be contacted with the lab results as soon as they are available. The fastest way to get your results is to activate your My Chart account. Instructions are located on the last page of this paperwork. If you have not heard from [place] regarding the results in 2 weeks, please contact this office.       I personally performed the services described in this  documentation, which was scribed in my presence. The recorded information has been reviewed and considered for accuracy and completeness, addended by me as needed, and agree with information above.  Signed,   Merri Ray, MD [facility] [place].  08/18/17 5:51 PM

## 2017-08-17 LAB — MICROALBUMIN, URINE: Microalbumin, Urine: 16.9 ug/mL

## 2017-08-28 ENCOUNTER — Encounter: Payer: Self-pay | Admitting: *Deleted

## 2017-08-30 ENCOUNTER — Encounter: Payer: Self-pay | Admitting: Family Medicine

## 2017-08-30 ENCOUNTER — Other Ambulatory Visit: Payer: Self-pay

## 2017-08-30 ENCOUNTER — Ambulatory Visit: Payer: BC Managed Care – PPO | Admitting: Family Medicine

## 2017-08-30 VITALS — BP 154/82 | HR 69 | Temp 98.6°F | Ht 63.0 in | Wt 187.0 lb

## 2017-08-30 DIAGNOSIS — E1165 Type 2 diabetes mellitus with hyperglycemia: Secondary | ICD-10-CM

## 2017-08-30 DIAGNOSIS — Z23 Encounter for immunization: Secondary | ICD-10-CM | POA: Diagnosis not present

## 2017-08-30 DIAGNOSIS — H538 Other visual disturbances: Secondary | ICD-10-CM

## 2017-08-30 DIAGNOSIS — R197 Diarrhea, unspecified: Secondary | ICD-10-CM | POA: Diagnosis not present

## 2017-08-30 NOTE — Patient Instructions (Addendum)
If you have more frequent diarrhea, we may need to change metformin. Let me know if that occurs.   Keep up the good work with diet an exercise. Numbers are looking much better. Goal blood sugars are below. Check fasting (first thing in the morning), or 2 hours after meals. At this point you can check your blood sugar once or twice per day.   Generally, you should have these blood sugar levels:  Before meals (preprandial): 80-130 mg/dL (0.4-5.4 mmol/L).  After meals (postprandial): lower than 180 mg/dL (10 mmol/L).  I can refer you to other nutritionist.  Let me know some names and I will refer you. See other info on diet below until you see nutritionist.   I referred you to eye specialist at prior visit. Let me know in the next week if you do not hear from their office.    Diabetes Mellitus and Nutrition When you have diabetes (diabetes mellitus), it is very important to have healthy eating habits because your blood sugar (glucose) levels are greatly affected by what you eat and drink. Eating healthy foods in the appropriate amounts, at about the same times every day, can help you:  Control your blood glucose.  Lower your risk of heart disease.  Improve your blood pressure.  Reach or maintain a healthy weight.  Every person with diabetes is different, and each person has different needs for a meal plan. Your health care provider may recommend that you work with a diet and nutrition specialist (dietitian) to make a meal plan that is best for you. Your meal plan may vary depending on factors such as:  The calories you need.  The medicines you take.  Your weight.  Your blood glucose, blood pressure, and cholesterol levels.  Your activity level.  Other health conditions you have, such as heart or kidney disease.  How do carbohydrates affect me? Carbohydrates affect your blood glucose level more than any other type of food. Eating carbohydrates naturally increases the amount of  glucose in your blood. Carbohydrate counting is a method for keeping track of how many carbohydrates you eat. Counting carbohydrates is important to keep your blood glucose at a healthy level, especially if you use insulin or take certain oral diabetes medicines. It is important to know how many carbohydrates you can safely have in each meal. This is different for every person. Your dietitian can help you calculate how many carbohydrates you should have at each meal and for snack. Foods that contain carbohydrates include:  Bread, cereal, rice, pasta, and crackers.  Potatoes and corn.  Peas, beans, and lentils.  Milk and yogurt.  Fruit and juice.  Desserts, such as cakes, cookies, ice cream, and candy.  How does alcohol affect me? Alcohol can cause a sudden decrease in blood glucose (hypoglycemia), especially if you use insulin or take certain oral diabetes medicines. Hypoglycemia can be a life-threatening condition. Symptoms of hypoglycemia (sleepiness, dizziness, and confusion) are similar to symptoms of having too much alcohol. If your health care provider says that alcohol is safe for you, follow these guidelines:  Limit alcohol intake to no more than 1 drink per day for nonpregnant women and 2 drinks per day for men. One drink equals 12 oz of beer, 5 oz of wine, or 1 oz of hard liquor.  Do not drink on an empty stomach.  Keep yourself hydrated with water, diet soda, or unsweetened iced tea.  Keep in mind that regular soda, juice, and other mixers may contain  a lot of sugar and must be counted as carbohydrates.  What are tips for following this plan? Reading food labels  Start by checking the serving size on the label. The amount of calories, carbohydrates, fats, and other nutrients listed on the label are based on one serving of the food. Many foods contain more than one serving per package.  Check the total grams (g) of carbohydrates in one serving. You can calculate the number  of servings of carbohydrates in one serving by dividing the total carbohydrates by 15. For example, if a food has 30 g of total carbohydrates, it would be equal to 2 servings of carbohydrates.  Check the number of grams (g) of saturated and trans fats in one serving. Choose foods that have low or no amount of these fats.  Check the number of milligrams (mg) of sodium in one serving. Most people should limit total sodium intake to less than 2,300 mg per day.  Always check the nutrition information of foods labeled as "low-fat" or "nonfat". These foods may be higher in added sugar or refined carbohydrates and should be avoided.  Talk to your dietitian to identify your daily goals for nutrients listed on the label. Shopping  Avoid buying canned, premade, or processed foods. These foods tend to be high in fat, sodium, and added sugar.  Shop around the outside edge of the grocery store. This includes fresh fruits and vegetables, bulk grains, fresh meats, and fresh dairy. Cooking  Use low-heat cooking methods, such as baking, instead of high-heat cooking methods like deep frying.  Cook using healthy oils, such as olive, canola, or sunflower oil.  Avoid cooking with butter, cream, or high-fat meats. Meal planning  Eat meals and snacks regularly, preferably at the same times every day. Avoid going long periods of time without eating.  Eat foods high in fiber, such as fresh fruits, vegetables, beans, and whole grains. Talk to your dietitian about how many servings of carbohydrates you can eat at each meal.  Eat 4-6 ounces of lean protein each day, such as lean meat, chicken, fish, eggs, or tofu. 1 ounce is equal to 1 ounce of meat, chicken, or fish, 1 egg, or 1/4 cup of tofu.  Eat some foods each day that contain healthy fats, such as avocado, nuts, seeds, and fish. Lifestyle   Check your blood glucose regularly.  Exercise at least 30 minutes 5 or more days each week, or as told by your  health care provider.  Take medicines as told by your health care provider.  Do not use any products that contain nicotine or tobacco, such as cigarettes and e-cigarettes. If you need help quitting, ask your health care provider.  Work with a Veterinary surgeon or diabetes educator to identify strategies to manage stress and any emotional and social challenges. What are some questions to ask my health care provider?  Do I need to meet with a diabetes educator?  Do I need to meet with a dietitian?  What number can I call if I have questions?  When are the best times to check my blood glucose? Where to find more information:  American Diabetes Association: diabetes.org/food-and-fitness/food  Academy of Nutrition and Dietetics: https://www.vargas.com/  [employer] of Diabetes and Digestive and Kidney Diseases (NIH): FindJewelers.cz Summary  A healthy meal plan will help you control your blood glucose and maintain a healthy lifestyle.  Working with a diet and nutrition specialist (dietitian) can help you make a meal plan that is best for  you.  Keep in mind that carbohydrates and alcohol have immediate effects on your blood glucose levels. It is important to count carbohydrates and to use alcohol carefully. This information is not intended to replace advice given to you by your health care provider. Make sure you discuss any questions you have with your health care provider. Document Released: 12/22/2004 Document Revised: 05/01/2016 Document Reviewed: 05/01/2016 Elsevier Interactive Patient Education  2018 [employer].   Carbohydrate Counting for Diabetes Mellitus, Adult Carbohydrate counting is a method for keeping track of how many carbohydrates you eat. Eating carbohydrates naturally increases the amount of sugar (glucose) in the blood. Counting how many carbohydrates you  eat helps keep your blood glucose within normal limits, which helps you manage your diabetes (diabetes mellitus). It is important to know how many carbohydrates you can safely have in each meal. This is different for every person. A diet and nutrition specialist (registered dietitian) can help you make a meal plan and calculate how many carbohydrates you should have at each meal and snack. Carbohydrates are found in the following foods:  Grains, such as breads and cereals.  Dried beans and soy products.  Starchy vegetables, such as potatoes, peas, and corn.  Fruit and fruit juices.  Milk and yogurt.  Sweets and snack foods, such as cake, cookies, candy, chips, and soft drinks.  How do I count carbohydrates? There are two ways to count carbohydrates in food. You can use either of the methods or a combination of both. Reading "Nutrition Facts" on packaged food The "Nutrition Facts" list is included on the labels of almost all packaged foods and beverages in the [place] It includes:  The serving size.  Information about nutrients in each serving, including the grams (g) of carbohydrate per serving.  To use the "Nutrition Facts":  Decide how many servings you will have.  Multiply the number of servings by the number of carbohydrates per serving.  The resulting number is the total amount of carbohydrates that you will be having.  Learning standard serving sizes of other foods When you eat foods containing carbohydrates that are not packaged or do not include "Nutrition Facts" on the label, you need to measure the servings in order to count the amount of carbohydrates:  Measure the foods that you will eat with a food scale or measuring cup, if needed.  Decide how many standard-size servings you will eat.  Multiply the number of servings by 15. Most carbohydrate-rich foods have about 15 g of carbohydrates per serving. ? For example, if you eat 8 oz (170 g) of strawberries, you will  have eaten 2 servings and 30 g of carbohydrates (2 servings x 15 g = 30 g).  For foods that have more than one food mixed, such as soups and casseroles, you must count the carbohydrates in each food that is included.  The following list contains standard serving sizes of common carbohydrate-rich foods. Each of these servings has about 15 g of carbohydrates:   hamburger bun or  English muffin.   oz (15 mL) syrup.   oz (14 g) jelly.  1 slice of bread.  1 six-inch tortilla.  3 oz (85 g) cooked rice or pasta.  4 oz (113 g) cooked dried beans.  4 oz (113 g) starchy vegetable, such as peas, corn, or potatoes.  4 oz (113 g) hot cereal.  4 oz (113 g) mashed potatoes or  of a large baked potato.  4 oz (113 g) canned or frozen  fruit.  4 oz (120 mL) fruit juice.  4-6 crackers.  6 chicken nuggets.  6 oz (170 g) unsweetened dry cereal.  6 oz (170 g) plain fat-free yogurt or yogurt sweetened with artificial sweeteners.  8 oz (240 mL) milk.  8 oz (170 g) fresh fruit or one small piece of fruit.  24 oz (680 g) popped popcorn.  Example of carbohydrate counting Sample meal  3 oz (85 g) chicken breast.  6 oz (170 g) brown rice.  4 oz (113 g) corn.  8 oz (240 mL) milk.  8 oz (170 g) strawberries with sugar-free whipped topping. Carbohydrate calculation 1. Identify the foods that contain carbohydrates: ? Rice. ? Corn. ? Milk. ? Strawberries. 2. Calculate how many servings you have of each food: ? 2 servings rice. ? 1 serving corn. ? 1 serving milk. ? 1 serving strawberries. 3. Multiply each number of servings by 15 g: ? 2 servings rice x 15 g = 30 g. ? 1 serving corn x 15 g = 15 g. ? 1 serving milk x 15 g = 15 g. ? 1 serving strawberries x 15 g = 15 g. 4. Add together all of the amounts to find the total grams of carbohydrates eaten: ? 30 g + 15 g + 15 g + 15 g = 75 g of carbohydrates total. This information is not intended to replace advice given to you by  your health care provider. Make sure you discuss any questions you have with your health care provider. Document Released: 03/27/2005 Document Revised: 10/15/2015 Document Reviewed: 09/08/2015 Elsevier Interactive Patient Education  2018 [employer].   IF you received an x-ray today, you will receive an invoice from [facility]. Please contact [facility] at [phone] with questions or concerns regarding your invoice.   IF you received labwork today, you will receive an invoice from [place]. Please contact [facility] at [phone] with questions or concerns regarding your invoice.   Our billing staff will not be able to assist you with questions regarding bills from these companies.  You will be contacted with the lab results as soon as they are available. The fastest way to get your results is to activate your My Chart account. Instructions are located on the last page of this paperwork. If you have not heard from [place] regarding the results in 2 weeks, please contact this office.

## 2017-08-30 NOTE — Progress Notes (Signed)
Subjective:  By signing my name below, I, Derek Hopkins, attest that this documentation has been prepared under the direction and in the presence of Derek Agreste, MD Electronically Signed: Ladene Artist, ED Scribe 08/30/2017 at 11:27 AM.   Patient ID: Derek Hopkins, male    DOB: 15-May-1970, 47 y.o.   MRN: [id number]  Chief Complaint  Patient presents with  . Diabetes    2 week f/u   HPI Derek Hopkins is a 48 y.o. male who presents to Primary Care at [facility] for f/u. Initially diagnosed with DM on 5/7. Started on metformin. Handout given on self care and glucose monitoring. Continued metformin 500 mg bid on 5/9 and referred to DM and nutrition classes. Referred to optho.  Pt reports blood glucose readings of 136-188 over the past few days. Lowest reading of 97 on 5/20, high of 237 immediately after a meal. Reports initial diarrhea 1-2 days which has resolved, abdominal pain, nausea, vomiting, any other symptom. States that he is now able to see better further away, however, up-close print appears to be "fuzzy". Also states that he has eliminated soda and juice from his diet and now only drinks water and Gatorade zero. Pt has also decreased fruit intake due to fear of consuming too much sugar. States he has been walking for exercise. He has an appointment scheduled with the nutritionist on 5/30, however, his insurance informed him that the nutritionist is not in network.  Wt Readings from Last 3 Encounters:  08/30/17 187 lb (84.8 kg)  08/16/17 188 lb 3.2 oz (85.4 kg)  08/14/17 190 lb (86.2 kg)  Body mass index is 33.13 kg/m.   Immunizations Immunization History  Administered Date(s) Administered  . Tdap 05/18/2014  Pneumovax: updated today  There are no active problems to display for this patient.  No past medical history on file. Past Surgical History:  Procedure Laterality Date  . None     No Known Allergies Prior to Admission medications   Medication Sig Start Date End Date  Taking? Authorizing Provider  blood glucose meter kit and supplies Dispense based on patient and insurance preference. Check 2 times per day. 08/16/17   Derek Agreste, MD  metFORMIN (GLUCOPHAGE) 500 MG tablet Take 1 tablet (500 mg total) by mouth 2 (two) times daily with a meal. 08/15/17   [name], PA-C   Social History   Socioeconomic History  . Marital status: Single    Spouse name: Not on file  . Number of children: 1  . Years of education: BS  . Highest education level: Not on file  Occupational History  . Occupation: [employer]  . Financial resource strain: Not on file  . Food insecurity:    Worry: Not on file    Inability: Not on file  . Transportation needs:    Medical: Not on file    Non-medical: Not on file  Tobacco Use  . Smoking status: Never Smoker  . Smokeless tobacco: Never Used  Substance and Sexual Activity  . Alcohol use: Yes    Alcohol/week: 0.0 oz    Comment: Rare occation  . Drug use: No    Comment: Quit 2002  . Sexual activity: Not on file  Lifestyle  . Physical activity:    Days per week: Not on file    Minutes per session: Not on file  . Stress: Not on file  Relationships  . Social connections:    Talks on phone: Not  on file    Gets together: Not on file    Attends religious service: Not on file    Active member of club or organization: Not on file    Attends meetings of clubs or organizations: Not on file    Relationship status: Not on file  . Intimate partner violence:    Fear of current or ex partner: Not on file    Emotionally abused: Not on file    Physically abused: Not on file    Forced sexual activity: Not on file  Other Topics Concern  . Not on file  Social History Narrative  . Not on file   Review of Systems  Constitutional: Negative for fatigue and unexpected weight change.  Eyes: Positive for visual disturbance.  Respiratory: Negative for cough, chest tightness and shortness of breath.     Cardiovascular: Negative for chest pain, palpitations and leg swelling.  Gastrointestinal: Positive for diarrhea (resolved). Negative for abdominal pain, blood in stool, nausea and vomiting.  Neurological: Negative for dizziness, light-headedness and headaches.      Objective:   Physical Exam  Constitutional: He is oriented to person, place, and time. He appears well-developed and well-nourished. No distress.  HENT:  Head: Normocephalic and atraumatic.  Eyes: Pupils are equal, round, and reactive to light. Conjunctivae and EOM are normal.  Neck: Neck supple. No JVD present. Carotid bruit is not present. No tracheal deviation present.  Cardiovascular: Normal rate, regular rhythm and normal heart sounds.  No murmur heard. Pulmonary/Chest: Effort normal and breath sounds normal. No respiratory distress. He has no rales.  Musculoskeletal: Normal range of motion. He exhibits no edema.  Neurological: He is alert and oriented to person, place, and time.  Skin: Skin is warm and dry.  Psychiatric: He has a normal mood and affect. His behavior is normal.  Nursing note and vitals reviewed.  Vitals:   08/30/17 1056  BP: (!) 154/82  Pulse: 69  Temp: 98.6 F (37 C)  TempSrc: Oral  SpO2: 98%  Weight: 187 lb (84.8 kg)  Height: _0  (1.6 m)      Assessment & Plan:   Derek Hopkins is a 47 y.o. male Type 2 diabetes mellitus with hyperglycemia, without long-term current use of insulin (HCC) - Plan: Microalbumin, urine Need for prophylactic vaccination against Streptococcus pneumoniae (pneumococcus) - Plan: Pneumococcal polysaccharide vaccine 23-valent greater than or equal to 2yo subcutaneous/IM Blurry vision Diarrhea, unspecified type  -Recent diagnosis of diabetes, overall tolerating metformin with occasional diarrhea.  Advised if that becomes more persistent would consider other medications.   -Handout given on goal glucose readings both fasting and postprandial, as well as information on  diet as he requested.  However he will call me or let us know if there is a specific nutritionist in network as I feel that would be beneficial for providing advice on diet effect on diabetes treatment.    -Pneumovax was given  -optho appointment should be pending as referral placed previously.  Blurry vision is improving as expected with his improving hyperglycemia. Recheck next 3 months, sooner if needed   No orders of the defined types were placed in this encounter.  Patient Instructions   If you have more frequent diarrhea, we may need to change metformin. Let me know if that occurs.   Keep up the good work with diet an exercise. Numbers are looking much better. Goal blood sugars are below. Check fasting (first thing in the morning), or 2 hours after meals. At this  point you can check your blood sugar once or twice per day.   Generally, you should have these blood sugar levels:  Before meals (preprandial): 80-130 mg/dL (4.4-7.2 mmol/L).  After meals (postprandial): lower than 180 mg/dL (10 mmol/L).  I can refer you to other nutritionist.  Let me know some names and I will refer you. See other info on diet below until you see nutritionist.   I referred you to eye specialist at prior visit. Let me know in the next week if you do not hear from their office.    Diabetes Mellitus and Nutrition When you have diabetes (diabetes mellitus), it is very important to have healthy eating habits because your blood sugar (glucose) levels are greatly affected by what you eat and drink. Eating healthy foods in the appropriate amounts, at about the same times every day, can help you:  Control your blood glucose.  Lower your risk of heart disease.  Improve your blood pressure.  Reach or maintain a healthy weight.  Every person with diabetes is different, and each person has different needs for a meal plan. Your health care provider may recommend that you work with a diet and nutrition specialist  (dietitian) to make a meal plan that is best for you. Your meal plan may vary depending on factors such as:  The calories you need.  The medicines you take.  Your weight.  Your blood glucose, blood pressure, and cholesterol levels.  Your activity level.  Other health conditions you have, such as heart or kidney disease.  How do carbohydrates affect me? Carbohydrates affect your blood glucose level more than any other type of food. Eating carbohydrates naturally increases the amount of glucose in your blood. Carbohydrate counting is a method for keeping track of how many carbohydrates you eat. Counting carbohydrates is important to keep your blood glucose at a healthy level, especially if you use insulin or take certain oral diabetes medicines. It is important to know how many carbohydrates you can safely have in each meal. This is different for every person. Your dietitian can help you calculate how many carbohydrates you should have at each meal and for snack. Foods that contain carbohydrates include:  Bread, cereal, rice, pasta, and crackers.  Potatoes and corn.  Peas, beans, and lentils.  Milk and yogurt.  Fruit and juice.  Desserts, such as cakes, cookies, ice cream, and candy.  How does alcohol affect me? Alcohol can cause a sudden decrease in blood glucose (hypoglycemia), especially if you use insulin or take certain oral diabetes medicines. Hypoglycemia can be a life-threatening condition. Symptoms of hypoglycemia (sleepiness, dizziness, and confusion) are similar to symptoms of having too much alcohol. If your health care provider says that alcohol is safe for you, follow these guidelines:  Limit alcohol intake to no more than 1 drink per day for nonpregnant women and 2 drinks per day for men. One drink equals 12 oz of beer, 5 oz of wine, or 1 oz of hard liquor.  Do not drink on an empty stomach.  Keep yourself hydrated with water, diet soda, or unsweetened iced  tea.  Keep in mind that regular soda, juice, and other mixers may contain a lot of sugar and must be counted as carbohydrates.  What are tips for following this plan? Reading food labels  Start by checking the serving size on the label. The amount of calories, carbohydrates, fats, and other nutrients listed on the label are based on one serving of the food.  Many foods contain more than one serving per package.  Check the total grams (g) of carbohydrates in one serving. You can calculate the number of servings of carbohydrates in one serving by dividing the total carbohydrates by 15. For example, if a food has 30 g of total carbohydrates, it would be equal to 2 servings of carbohydrates.  Check the number of grams (g) of saturated and trans fats in one serving. Choose foods that have low or no amount of these fats.  Check the number of milligrams (mg) of sodium in one serving. Most people should limit total sodium intake to less than 2,300 mg per day.  Always check the nutrition information of foods labeled as "low-fat" or "nonfat". These foods may be higher in added sugar or refined carbohydrates and should be avoided.  Talk to your dietitian to identify your daily goals for nutrients listed on the label. Shopping  Avoid buying canned, premade, or processed foods. These foods tend to be high in fat, sodium, and added sugar.  Shop around the outside edge of the grocery store. This includes fresh fruits and vegetables, bulk grains, fresh meats, and fresh dairy. Cooking  Use low-heat cooking methods, such as baking, instead of high-heat cooking methods like deep frying.  Cook using healthy oils, such as olive, canola, or sunflower oil.  Avoid cooking with butter, cream, or high-fat meats. Meal planning  Eat meals and snacks regularly, preferably at the same times every day. Avoid going long periods of time without eating.  Eat foods high in fiber, such as fresh fruits, vegetables,  beans, and whole grains. Talk to your dietitian about how many servings of carbohydrates you can eat at each meal.  Eat 4-6 ounces of lean protein each day, such as lean meat, chicken, fish, eggs, or tofu. 1 ounce is equal to 1 ounce of meat, chicken, or fish, 1 egg, or 1/4 cup of tofu.  Eat some foods each day that contain healthy fats, such as avocado, nuts, seeds, and fish. Lifestyle   Check your blood glucose regularly.  Exercise at least 30 minutes 5 or more days each week, or as told by your health care provider.  Take medicines as told by your health care provider.  Do not use any products that contain nicotine or tobacco, such as cigarettes and e-cigarettes. If you need help quitting, ask your health care provider.  Work with a Social worker or diabetes educator to identify strategies to manage stress and any emotional and social challenges. What are some questions to ask my health care provider?  Do I need to meet with a diabetes educator?  Do I need to meet with a dietitian?  What number can I call if I have questions?  When are the best times to check my blood glucose? Where to find more information:  American Diabetes Association: diabetes.org/food-and-fitness/food  Academy of Nutrition and Dietetics: PokerClues.dk  [employer] of Diabetes and Digestive and Kidney Diseases (NIH): ContactWire.be Summary  A healthy meal plan will help you control your blood glucose and maintain a healthy lifestyle.  Working with a diet and nutrition specialist (dietitian) can help you make a meal plan that is best for you.  Keep in mind that carbohydrates and alcohol have immediate effects on your blood glucose levels. It is important to count carbohydrates and to use alcohol carefully. This information is not intended to replace advice given to you by your  health care provider. Make sure you discuss any questions you have  with your health care provider. Document Released: 12/22/2004 Document Revised: 05/01/2016 Document Reviewed: 05/01/2016 Elsevier Interactive Patient Education  2018 [employer].   Carbohydrate Counting for Diabetes Mellitus, Adult Carbohydrate counting is a method for keeping track of how many carbohydrates you eat. Eating carbohydrates naturally increases the amount of sugar (glucose) in the blood. Counting how many carbohydrates you eat helps keep your blood glucose within normal limits, which helps you manage your diabetes (diabetes mellitus). It is important to know how many carbohydrates you can safely have in each meal. This is different for every person. A diet and nutrition specialist (registered dietitian) can help you make a meal plan and calculate how many carbohydrates you should have at each meal and snack. Carbohydrates are found in the following foods:  Grains, such as breads and cereals.  Dried beans and soy products.  Starchy vegetables, such as potatoes, peas, and corn.  Fruit and fruit juices.  Milk and yogurt.  Sweets and snack foods, such as cake, cookies, candy, chips, and soft drinks.  How do I count carbohydrates? There are two ways to count carbohydrates in food. You can use either of the methods or a combination of both. Reading "Nutrition Facts" on packaged food The "Nutrition Facts" list is included on the labels of almost all packaged foods and beverages in the [place] It includes:  The serving size.  Information about nutrients in each serving, including the grams (g) of carbohydrate per serving.  To use the "Nutrition Facts":  Decide how many servings you will have.  Multiply the number of servings by the number of carbohydrates per serving.  The resulting number is the total amount of carbohydrates that you will be having.  Learning standard serving sizes of other foods When you  eat foods containing carbohydrates that are not packaged or do not include "Nutrition Facts" on the label, you need to measure the servings in order to count the amount of carbohydrates:  Measure the foods that you will eat with a food scale or measuring cup, if needed.  Decide how many standard-size servings you will eat.  Multiply the number of servings by 15. Most carbohydrate-rich foods have about 15 g of carbohydrates per serving. ? For example, if you eat 8 oz (170 g) of strawberries, you will have eaten 2 servings and 30 g of carbohydrates (2 servings x 15 g = 30 g).  For foods that have more than one food mixed, such as soups and casseroles, you must count the carbohydrates in each food that is included.  The following list contains standard serving sizes of common carbohydrate-rich foods. Each of these servings has about 15 g of carbohydrates:   hamburger bun or  English muffin.   oz (15 mL) syrup.   oz (14 g) jelly.  1 slice of bread.  1 six-inch tortilla.  3 oz (85 g) cooked rice or pasta.  4 oz (113 g) cooked dried beans.  4 oz (113 g) starchy vegetable, such as peas, corn, or potatoes.  4 oz (113 g) hot cereal.  4 oz (113 g) mashed potatoes or  of a large baked potato.  4 oz (113 g) canned or frozen fruit.  4 oz (120 mL) fruit juice.  4-6 crackers.  6 chicken nuggets.  6 oz (170 g) unsweetened dry cereal.  6 oz (170 g) plain fat-free yogurt or yogurt sweetened with artificial sweeteners.  8 oz (240 mL) milk.  8 oz (170 g) fresh fruit or one small  piece of fruit.  24 oz (680 g) popped popcorn.  Example of carbohydrate counting Sample meal  3 oz (85 g) chicken breast.  6 oz (170 g) brown rice.  4 oz (113 g) corn.  8 oz (240 mL) milk.  8 oz (170 g) strawberries with sugar-free whipped topping. Carbohydrate calculation 1. Identify the foods that contain carbohydrates: ? Rice. ? Corn. ? Milk. ? Strawberries. 2. Calculate how many  servings you have of each food: ? 2 servings rice. ? 1 serving corn. ? 1 serving milk. ? 1 serving strawberries. 3. Multiply each number of servings by 15 g: ? 2 servings rice x 15 g = 30 g. ? 1 serving corn x 15 g = 15 g. ? 1 serving milk x 15 g = 15 g. ? 1 serving strawberries x 15 g = 15 g. 4. Add together all of the amounts to find the total grams of carbohydrates eaten: ? 30 g + 15 g + 15 g + 15 g = 75 g of carbohydrates total. This information is not intended to replace advice given to you by your health care provider. Make sure you discuss any questions you have with your health care provider. Document Released: 03/27/2005 Document Revised: 10/15/2015 Document Reviewed: 09/08/2015 Elsevier Interactive Patient Education  2018 [employer].   IF you received an x-ray today, you will receive an invoice from [facility]. Please contact [facility] at [phone] with questions or concerns regarding your invoice.   IF you received labwork today, you will receive an invoice from [facility]. Please contact [facility] at [phone] with questions or concerns regarding your invoice.   Our billing staff will not be able to assist you with questions regarding bills from these companies.  You will be contacted with the lab results as soon as they are available. The fastest way to get your results is to activate your My Chart account. Instructions are located on the last page of this paperwork. If you have not heard from [place] regarding the results in 2 weeks, please contact this office.       I personally performed the services described in this documentation, which was scribed in my presence. The recorded information has been reviewed and considered for accuracy and completeness, addended by me as needed, and agree with information above.  Signed,   Merri Ray, MD [facility] [place].  08/30/17 2:51 PM

## 2017-08-31 LAB — MICROALBUMIN, URINE: Microalbumin, Urine: 3.9 ug/mL

## 2017-09-06 ENCOUNTER — Ambulatory Visit: Payer: BC Managed Care – PPO | Admitting: Registered"

## 2017-09-17 ENCOUNTER — Encounter: Payer: Self-pay | Admitting: *Deleted

## 2017-09-21 LAB — HM DIABETES EYE EXAM

## 2017-10-22 ENCOUNTER — Other Ambulatory Visit: Payer: Self-pay | Admitting: Family Medicine

## 2017-10-24 ENCOUNTER — Encounter: Payer: Self-pay | Admitting: *Deleted

## 2017-11-29 ENCOUNTER — Ambulatory Visit: Payer: BC Managed Care – PPO | Admitting: Family Medicine

## 2017-12-06 ENCOUNTER — Encounter: Payer: Self-pay | Admitting: Family Medicine

## 2017-12-06 ENCOUNTER — Ambulatory Visit: Payer: BC Managed Care – PPO | Admitting: Family Medicine

## 2017-12-06 ENCOUNTER — Other Ambulatory Visit: Payer: Self-pay

## 2017-12-06 VITALS — BP 138/78 | HR 67 | Temp 98.7°F | Ht 63.0 in | Wt 178.4 lb

## 2017-12-06 DIAGNOSIS — Z13220 Encounter for screening for lipoid disorders: Secondary | ICD-10-CM | POA: Diagnosis not present

## 2017-12-06 DIAGNOSIS — R109 Unspecified abdominal pain: Secondary | ICD-10-CM | POA: Diagnosis not present

## 2017-12-06 DIAGNOSIS — E1165 Type 2 diabetes mellitus with hyperglycemia: Secondary | ICD-10-CM | POA: Diagnosis not present

## 2017-12-06 LAB — COMPREHENSIVE METABOLIC PANEL
ALBUMIN: 4.6 g/dL (ref 3.5–5.5)
ALK PHOS: 77 IU/L (ref 39–117)
ALT: 21 IU/L (ref 0–44)
AST: 20 IU/L (ref 0–40)
Albumin/Globulin Ratio: 1.4 (ref 1.2–2.2)
BILIRUBIN TOTAL: 0.5 mg/dL (ref 0.0–1.2)
BUN / CREAT RATIO: 14 (ref 9–20)
BUN: 18 mg/dL (ref 6–24)
CHLORIDE: 100 mmol/L (ref 96–106)
CO2: 21 mmol/L (ref 20–29)
CREATININE: 1.25 mg/dL (ref 0.76–1.27)
Calcium: 9.9 mg/dL (ref 8.7–10.2)
GFR calc non Af Amer: 68 mL/min/{1.73_m2} (ref >59)
GFR, EST AFRICAN AMERICAN: 79 mL/min/{1.73_m2} (ref >59)
GLOBULIN, TOTAL: 3.4 g/dL (ref 1.5–4.5)
GLUCOSE: 85 mg/dL (ref 65–99)
Potassium: 4 mmol/L (ref 3.5–5.2)
SODIUM: 140 mmol/L (ref 134–144)
TOTAL PROTEIN: 8 g/dL (ref 6.0–8.5)

## 2017-12-06 LAB — LIPID PANEL
CHOLESTEROL TOTAL: 201 mg/dL — AB (ref 100–199)
Chol/HDL Ratio: 4.7 ratio (ref 0.0–5.0)
HDL: 43 mg/dL (ref >39)
LDL Calculated: 140 mg/dL — ABNORMAL HIGH (ref 0–99)
Triglycerides: 88 mg/dL (ref 0–149)
VLDL CHOLESTEROL CAL: 18 mg/dL (ref 5–40)

## 2017-12-06 LAB — HEMOGLOBIN A1C
ESTIMATED AVERAGE GLUCOSE: 117 mg/dL
HEMOGLOBIN A1C: 5.7 % — AB (ref 4.8–5.6)

## 2017-12-06 LAB — GLUCOSE, POCT (MANUAL RESULT ENTRY): POC Glucose: 70 mg/dl (ref 70–99)

## 2017-12-06 MED ORDER — DAPAGLIFLOZIN PROPANEDIOL 5 MG PO TABS: 5.0000 mg | ORAL_TABLET | Freq: Every day | ORAL | 2 refills | Status: DC

## 2017-12-06 NOTE — Progress Notes (Signed)
Subjective:    Patient ID: Derek Hopkins, male    DOB: 1971/03/28, 47 y.o.   MRN: [id number]  HPI Derek Hopkins is a 47 y.o. male Presents today for: Chief Complaint  Patient presents with  . Diabetes    f/u (this past month has been taking medication off and on) metformin has side effects of diarrah     Diabetes: New diagnosis in May.  Started metformin, handout given on diabetes at that time.  Was referred to nutrition, as well as ophthalmology(6/14).   He reported some initial diarrhea, that had improved. Walking for exercise at that time. pneumovax given last ov.  Urine microalbumin 08/30/17 normal.   Lab Results  Component Value Date   HGBA1C 10.7 08/16/2017   Wt Readings from Last 3 Encounters:  12/06/17 178 lb 6.4 oz (80.9 kg)  08/30/17 187 lb (84.8 kg)  08/16/17 188 lb 3.2 oz (85.4 kg)   Off routine past 2 months, intermittent dosing of metformin, 1 pill every other day due to fear of diarrhea. Has diarrhea 1-2 x/week but inconvenient.  Cut soda and juices.  Home readings - 104-140.   Less exercise past few months, but getting back into routine. Unsure of timing of diarrhea and metformin dosing, even once per day.    Occasional R flank pain, no n/v. No urinary symptoms or hematuria. Ok at this time. No hx of kidney stones. No fever. asymptomatic at present.   There are no active problems to display for this patient.  No past medical history on file. Past Surgical History:  Procedure Laterality Date  . None     No Known Allergies Prior to Admission medications   Medication Sig Start Date End Date Taking? Authorizing Provider  ACCU-CHEK FASTCLIX LANCETS [place] CHECK BLOOD SUGAR TWICE DAILY 10/22/17   [name], MD  blood glucose meter kit and supplies Dispense based on patient and insurance preference. Check 2 times per day. 08/16/17   [name], MD  glucose blood (ACCU-CHEK GUIDE) test strip CHECK BLOOD SUGAR TWICE DAILY 10/22/17   [name], MD    metFORMIN (GLUCOPHAGE) 500 MG tablet Take 1 tablet (500 mg total) by mouth 2 (two) times daily with a meal. 08/15/17   [name], PA-C   Social History   Socioeconomic History  . Marital status: Single    Spouse name: Not on file  . Number of children: 1  . Years of education: BS  . Highest education level: Not on file  Occupational History  . Occupation: [employer]  . Financial resource strain: Not on file  . Food insecurity:    Worry: Not on file    Inability: Not on file  . Transportation needs:    Medical: Not on file    Non-medical: Not on file  Tobacco Use  . Smoking status: Never Smoker  . Smokeless tobacco: Never Used  Substance and Sexual Activity  . Alcohol use: Yes    Alcohol/week: 0.0 standard drinks    Comment: Rare occation  . Drug use: No    Comment: Quit 2002  . Sexual activity: Not on file  Lifestyle  . Physical activity:    Days per week: Not on file    Minutes per session: Not on file  . Stress: Not on file  Relationships  . Social connections:    Talks on phone: Not on file    Gets together: Not on file    Attends  religious service: Not on file    Active member of club or organization: Not on file    Attends meetings of clubs or organizations: Not on file    Relationship status: Not on file  . Intimate partner violence:    Fear of current or ex partner: Not on file    Emotionally abused: Not on file    Physically abused: Not on file    Forced sexual activity: Not on file  Other Topics Concern  . Not on file  Social History Narrative  . Not on file    Review of Systems  Constitutional: Negative for fatigue and unexpected weight change.  Eyes: Negative for visual disturbance.  Respiratory: Negative for cough, chest tightness and shortness of breath.   Cardiovascular: Negative for chest pain, palpitations and leg swelling.  Gastrointestinal: Negative for abdominal pain and blood in stool.  Neurological: Negative  for dizziness, light-headedness and headaches.   As in HPI.     Objective:   Physical Exam  Constitutional: He is oriented to person, place, and time. He appears well-developed and well-nourished.  HENT:  Head: Normocephalic and atraumatic.  Eyes: Pupils are equal, round, and reactive to light. EOM are normal.  Neck: No JVD present. Carotid bruit is not present.  Cardiovascular: Normal rate, regular rhythm and normal heart sounds.  No murmur heard. Pulmonary/Chest: Effort normal and breath sounds normal. He has no rales.  Abdominal: Soft. Bowel sounds are normal. There is no tenderness. There is no rebound, no CVA tenderness, no tenderness at McBurney's point and negative Murphy's sign.  Musculoskeletal: He exhibits no edema.       Lumbar back: He exhibits no tenderness and no bony tenderness.  Neurological: He is alert and oriented to person, place, and time.  Skin: Skin is warm and dry.  Psychiatric: He has a normal mood and affect.  Vitals reviewed.  Vitals:   12/06/17 0956 12/06/17 1002  BP: (!) 159/92 138/78  Pulse: 67   Temp: 98.7 F (37.1 C)   TempSrc: Oral   SpO2: 98%   Weight: 178 lb 6.4 oz (80.9 kg)   Height: 5' 3"  (1.6 m)        Assessment & Plan:    Derek Hopkins is a 47 y.o. male Type 2 diabetes mellitus with hyperglycemia, without long-term current use of insulin ([place]) - Plan: POCT glucose (manual entry), dapagliflozin propanediol (FARXIGA) 5 MG TABS tablet, Hemoglobin A1c  - stop metformin d/t diarheea and fear of return of diarrhea.   - start farxiga, potential side effects discussed.  - check A1c.    Right flank pain  - now asymptomatic. RTC precautions if recurs for further workup.   Screening for hyperlipidemia - Plan: Lipid panel, Comprehensive metabolic panel  - check ASCVD risk calculation, consider statin depending on results with hx of DM.   Meds ordered this encounter  Medications  . dapagliflozin propanediol (FARXIGA) 5 MG TABS tablet     Sig: Take 5 mg by mouth daily.    Dispense:  30 tablet    Refill:  2   Patient Instructions   We will change to other med for diabetes. Stop metformin. Recheck in 3 months.   If flank pain returns, please return to discuss further.    Type 2 Diabetes Mellitus, Self Care, Adult When you have type 2 diabetes (type 2 diabetes mellitus), you must keep your blood sugar (glucose) under control. You can do this with:  Nutrition.  Exercise.  Lifestyle changes.  Medicines or insulin, if needed.  Support from your doctors and others.  How do I manage my blood sugar?  Check your blood sugar level every day, as often as told.  Call your doctor if your blood sugar is above your goal numbers for 2 tests in a row.  Have your A1c (hemoglobin A1c) level checked at least two times a year. Have it checked more often if your doctor tells you to. Your doctor will set treatment goals for you. Generally, you should have these blood sugar levels:  Before meals (preprandial): 80-130 mg/dL (4.4-7.2 mmol/L).  After meals (postprandial): lower than 180 mg/dL (10 mmol/L).  A1c level: less than 7%.  What do I need to know about high blood sugar? High blood sugar is called hyperglycemia. Know the signs of high blood sugar. Signs may include:  Feeling: ? Thirsty. ? Hungry. ? Very tired.  Needing to pee (urinate) more than usual.  Blurry vision.  What do I need to know about low blood sugar? Low blood sugar is called hypoglycemia. This is when blood sugar is at or below 70 mg/dL (3.9 mmol/L). Symptoms may include:  Feeling: ? Hungry. ? Worried or nervous (anxious). ? Sweaty and clammy. ? Confused. ? Dizzy. ? Sleepy. ? Sick to your stomach (nauseous).  Having: ? A fast heartbeat (palpitations). ? A headache. ? A change in your vision. ? Jerky movements that you cannot control (seizure). ? Nightmares. ? Tingling or no feeling (numbness) around the mouth, lips, or tongue.  Having  trouble with: ? Talking. ? Paying attention (concentrating). ? Moving (coordination). ? Sleeping.  Shaking.  Passing out (fainting).  Getting upset easily (irritability).  Treating low blood sugar  To treat low blood sugar, eat or drink something sugary right away. If you can think clearly and swallow safely, follow the 15:15 rule:  Take 15 grams of a fast-acting carb (carbohydrate). Some fast-acting carbs are: ? 1 tube of glucose gel. ? 3 sugar tablets (glucose pills). ? 6-8 pieces of hard candy. ? 4 oz (120 mL) of fruit juice. ? 4 oz (120 mL) regular (not diet) soda.  Check your blood sugar 15 minutes after you take the carb.  If your blood sugar is still at or below 70 mg/dL (3.9 mmol/L), take 15 grams of a carb again.  If your blood sugar does not go above 70 mg/dL (3.9 mmol/L) after 3 tries, get help right away.  After your blood sugar goes back to normal, eat a meal or a snack within 1 hour.  Treating very low blood sugar If your blood sugar is at or below 54 mg/dL (3 mmol/L), you have very low blood sugar (severe hypoglycemia). This is an emergency. Do not wait to see if the symptoms will go away. Get medical help right away. Call your local emergency services (911 in the [place] Do not drive yourself to the hospital. If you have very low blood sugar and you cannot eat or drink, you may need a glucagon shot (injection). A family member or friend should learn how to check your blood sugar and how to give you a glucagon shot. Ask your doctor if you need to have a glucagon shot kit at home. What else is important to manage my diabetes? Medicine Follow these instructions about insulin and diabetes medicines:  Take them as told by your doctor.  Adjust them as told by your doctor.  Do not run out of them.  Having diabetes can raise your risk for  other long-term conditions. These include heart or kidney disease. Your doctor may prescribe medicines to help prevent problems  from diabetes. Food   Make healthy food choices. These include: ? Chicken, fish, egg whites, and beans. ? Oats, whole wheat, bulgur, brown rice, quinoa, and millet. ? Fresh fruits and vegetables. ? Low-fat dairy products. ? Nuts, avocado, olive oil, and canola oil.  Make a food plan with a specialist (dietitian).  Follow instructions from your doctor about what you cannot eat or drink.  Drink enough fluid to keep your pee (urine) clear or pale yellow.  Eat healthy snacks between healthy meals.  Keep track of carbs that you eat. Read food labels. Learn food serving sizes.  Follow your sick day plan when you cannot eat or drink normally. Make this plan with your doctor so it is ready to use. Activity  Exercise at least 3 times a week.  Do not go more than 2 days without exercising.  Talk with your doctor before you start a new exercise. Your doctor may need to adjust your insulin, medicines, or food. Lifestyle   Do not use any tobacco products. These include cigarettes, chewing tobacco, and e-cigarettes.If you need help quitting, ask your doctor.  Ask your doctor how much alcohol is safe for you.  Learn to deal with stress. If you need help with this, ask your doctor. Body care  Stay up to date with your shots (immunizations).  Have your eyes and feet checked by a doctor as often as told.  Check your skin and feet every day. Check for cuts, bruises, redness, blisters, or sores.  Brush your teeth and gums two times a day.  Floss at least one time a day.  Go to the dentist least one time every 6 months.  Stay at a healthy weight. General instructions   Take over-the-counter and prescription medicines only as told by your doctor.  Share your diabetes care plan with: ? Your work or school. ? People you live with.  Check your pee (urine) for ketones: ? When you are sick. ? As told by your doctor.  Carry a card or wear jewelry that says that you have  diabetes.  Ask your doctor: ? Do I need to meet with a diabetes educator? ? Where can I find a support group for people with diabetes?  Keep all follow-up visits as told by your doctor. This is important. Where to find more information: To learn more about diabetes, visit:  American Diabetes Association: www.diabetes.org  American Association of Diabetes Educators: www.diabeteseducator.org/patient-resources  This information is not intended to replace advice given to you by your health care provider. Make sure you discuss any questions you have with your health care provider. Document Released: 07/19/2015 Document Revised: 09/02/2015 Document Reviewed: 04/30/2015 Elsevier Interactive Patient Education  [employer].   If you have lab work done today you will be contacted with your lab results within the next 2 weeks.  If you have not heard from [place] then please contact us. The fastest way to get your results is to register for My Chart.   IF you received an x-ray today, you will receive an invoice from [facility]. Please contact [facility] at [phone] with questions or concerns regarding your invoice.   IF you received labwork today, you will receive an invoice from [facility]. Please contact [facility] at [phone] with questions or concerns regarding your invoice.   Our billing staff will not be able to assist you with  questions regarding bills from these companies.  You will be contacted with the lab results as soon as they are available. The fastest way to get your results is to activate your My Chart account. Instructions are located on the last page of this paperwork. If you have not heard from [place] regarding the results in 2 weeks, please contact this office.       Signed,   Merri Ray, MD [facility].  12/09/17 12:08 AM

## 2017-12-06 NOTE — Patient Instructions (Addendum)
We will change to other med for diabetes. Stop metformin. Recheck in 3 months.   If flank pain returns, please return to discuss further.    Type 2 Diabetes Mellitus, Self Care, Adult When you have type 2 diabetes (type 2 diabetes mellitus), you must keep your blood sugar (glucose) under control. You can do this with:  Nutrition.  Exercise.  Lifestyle changes.  Medicines or insulin, if needed.  Support from your doctors and others.  How do I manage my blood sugar?  Check your blood sugar level every day, as often as told.  Call your doctor if your blood sugar is above your goal numbers for 2 tests in a row.  Have your A1c (hemoglobin A1c) level checked at least two times a year. Have it checked more often if your doctor tells you to. Your doctor will set treatment goals for you. Generally, you should have these blood sugar levels:  Before meals (preprandial): 80-130 mg/dL (4.4-7.2 mmol/L).  After meals (postprandial): lower than 180 mg/dL (10 mmol/L).  A1c level: less than 7%.  What do I need to know about high blood sugar? High blood sugar is called hyperglycemia. Know the signs of high blood sugar. Signs may include:  Feeling: ? Thirsty. ? Hungry. ? Very tired.  Needing to pee (urinate) more than usual.  Blurry vision.  What do I need to know about low blood sugar? Low blood sugar is called hypoglycemia. This is when blood sugar is at or below 70 mg/dL (3.9 mmol/L). Symptoms may include:  Feeling: ? Hungry. ? Worried or nervous (anxious). ? Sweaty and clammy. ? Confused. ? Dizzy. ? Sleepy. ? Sick to your stomach (nauseous).  Having: ? A fast heartbeat (palpitations). ? A headache. ? A change in your vision. ? Jerky movements that you cannot control (seizure). ? Nightmares. ? Tingling or no feeling (numbness) around the mouth, lips, or tongue.  Having trouble with: ? Talking. ? Paying attention (concentrating). ? Moving  (coordination). ? Sleeping.  Shaking.  Passing out (fainting).  Getting upset easily (irritability).  Treating low blood sugar  To treat low blood sugar, eat or drink something sugary right away. If you can think clearly and swallow safely, follow the 15:15 rule:  Take 15 grams of a fast-acting carb (carbohydrate). Some fast-acting carbs are: ? 1 tube of glucose gel. ? 3 sugar tablets (glucose pills). ? 6-8 pieces of hard candy. ? 4 oz (120 mL) of fruit juice. ? 4 oz (120 mL) regular (not diet) soda.  Check your blood sugar 15 minutes after you take the carb.  If your blood sugar is still at or below 70 mg/dL (3.9 mmol/L), take 15 grams of a carb again.  If your blood sugar does not go above 70 mg/dL (3.9 mmol/L) after 3 tries, get help right away.  After your blood sugar goes back to normal, eat a meal or a snack within 1 hour.  Treating very low blood sugar If your blood sugar is at or below 54 mg/dL (3 mmol/L), you have very low blood sugar (severe hypoglycemia). This is an emergency. Do not wait to see if the symptoms will go away. Get medical help right away. Call your local emergency services (911 in the [place] Do not drive yourself to the hospital. If you have very low blood sugar and you cannot eat or drink, you may need a glucagon shot (injection). A family member or friend should learn how to check your blood sugar and how to   give you a glucagon shot. Ask your doctor if you need to have a glucagon shot kit at home. What else is important to manage my diabetes? Medicine Follow these instructions about insulin and diabetes medicines:  Take them as told by your doctor.  Adjust them as told by your doctor.  Do not run out of them.  Having diabetes can raise your risk for other long-term conditions. These include heart or kidney disease. Your doctor may prescribe medicines to help prevent problems from diabetes. Food   Make healthy food choices. These  include: ? Chicken, fish, egg whites, and beans. ? Oats, whole wheat, bulgur, brown rice, quinoa, and millet. ? Fresh fruits and vegetables. ? Low-fat dairy products. ? Nuts, avocado, olive oil, and canola oil.  Make a food plan with a specialist (dietitian).  Follow instructions from your doctor about what you cannot eat or drink.  Drink enough fluid to keep your pee (urine) clear or pale yellow.  Eat healthy snacks between healthy meals.  Keep track of carbs that you eat. Read food labels. Learn food serving sizes.  Follow your sick day plan when you cannot eat or drink normally. Make this plan with your doctor so it is ready to use. Activity  Exercise at least 3 times a week.  Do not go more than 2 days without exercising.  Talk with your doctor before you start a new exercise. Your doctor may need to adjust your insulin, medicines, or food. Lifestyle   Do not use any tobacco products. These include cigarettes, chewing tobacco, and e-cigarettes.If you need help quitting, ask your doctor.  Ask your doctor how much alcohol is safe for you.  Learn to deal with stress. If you need help with this, ask your doctor. Body care  Stay up to date with your shots (immunizations).  Have your eyes and feet checked by a doctor as often as told.  Check your skin and feet every day. Check for cuts, bruises, redness, blisters, or sores.  Brush your teeth and gums two times a day.  Floss at least one time a day.  Go to the dentist least one time every 6 months.  Stay at a healthy weight. General instructions   Take over-the-counter and prescription medicines only as told by your doctor.  Share your diabetes care plan with: ? Your work or school. ? People you live with.  Check your pee (urine) for ketones: ? When you are sick. ? As told by your doctor.  Carry a card or wear jewelry that says that you have diabetes.  Ask your doctor: ? Do I need to meet with a diabetes  educator? ? Where can I find a support group for people with diabetes?  Keep all follow-up visits as told by your doctor. This is important. Where to find more information: To learn more about diabetes, visit:  American Diabetes Association: www.diabetes.org  American Association of Diabetes Educators: www.diabeteseducator.org/patient-resources  This information is not intended to replace advice given to you by your health care provider. Make sure you discuss any questions you have with your health care provider. Document Released: 07/19/2015 Document Revised: 09/02/2015 Document Reviewed: 04/30/2015 Elsevier Interactive Patient Education  [employer].   If you have lab work done today you will be contacted with your lab results within the next 2 weeks.  If you have not heard from [place] then please contact us. The fastest way to get your results is to register for My Chart.  IF you received an x-ray today, you will receive an invoice from [facility]. Please contact [facility] at [phone] with questions or concerns regarding your invoice.   IF you received labwork today, you will receive an invoice from [facility]. Please contact [facility] at [phone] with questions or concerns regarding your invoice.   Our billing staff will not be able to assist you with questions regarding bills from these companies.  You will be contacted with the lab results as soon as they are available. The fastest way to get your results is to activate your My Chart account. Instructions are located on the last page of this paperwork. If you have not heard from us regarding the results in 2 weeks, please contact this office.      

## 2017-12-09 ENCOUNTER — Encounter: Payer: Self-pay | Admitting: Family Medicine

## 2018-03-14 ENCOUNTER — Ambulatory Visit: Payer: BC Managed Care – PPO | Admitting: Family Medicine

## 2019-03-04 ENCOUNTER — Other Ambulatory Visit: Payer: Self-pay

## 2019-03-04 DIAGNOSIS — Z20822 Contact with and (suspected) exposure to covid-19: Secondary | ICD-10-CM

## 2019-03-06 LAB — NOVEL CORONAVIRUS, NAA: SARS-CoV-2, NAA: NOT DETECTED

## 2019-06-07 ENCOUNTER — Ambulatory Visit: Payer: BC Managed Care – PPO | Attending: Internal Medicine

## 2019-06-07 DIAGNOSIS — Z23 Encounter for immunization: Secondary | ICD-10-CM | POA: Insufficient documentation

## 2019-06-07 NOTE — Progress Notes (Signed)
   Covid-19 Vaccination Clinic  Name:  Derek Hopkins    MRN: [id number] DOB: December 15, 1970  06/07/2019  Derek Hopkins was observed post Covid-19 immunization for 15 minutes without incidence. He was provided with Vaccine Information Sheet and instruction to access the V-Safe system.   Derek Hopkins was instructed to call 911 with any severe reactions post vaccine: Marland Kitchen Difficulty breathing  . Swelling of your face and throat  . A fast heartbeat  . A bad rash all over your body  . Dizziness and weakness    Immunizations Administered    Name Date Dose VIS Date Route   Pfizer COVID-19 Vaccine 06/07/2019  5:09 PM 0.3 mL 03/21/2019 Intramuscular   Manufacturer: [employer], [employer]   Lot: [id number]   NDC: [address]-3

## 2019-06-28 ENCOUNTER — Ambulatory Visit: Payer: BC Managed Care – PPO | Attending: Internal Medicine

## 2019-06-28 DIAGNOSIS — Z23 Encounter for immunization: Secondary | ICD-10-CM

## 2019-06-28 NOTE — Progress Notes (Signed)
   Covid-19 Vaccination Clinic  Name:  Derek Hopkins    MRN: [id number] DOB: 11/05/1970  06/28/2019  [name] was observed post Covid-19 immunization for 15 minutes without incident. He was provided with Vaccine Information Sheet and instruction to access the V-Safe system.   [name] was instructed to call 911 with any severe reactions post vaccine: Marland Kitchen Difficulty breathing  . Swelling of face and throat  . A fast heartbeat  . A bad rash all over body  . Dizziness and weakness   Immunizations Administered    Name Date Dose VIS Date Route   Pfizer COVID-19 Vaccine 06/28/2019  9:08 AM 0.3 mL 03/21/2019 Intramuscular   Manufacturer: [employer], [employer]   Lot: [id number]   NDC: [address]-0

## 2020-06-18 ENCOUNTER — Encounter (HOSPITAL_BASED_OUTPATIENT_CLINIC_OR_DEPARTMENT_OTHER): Payer: Self-pay | Admitting: Orthopedic Surgery

## 2020-06-18 ENCOUNTER — Other Ambulatory Visit (HOSPITAL_COMMUNITY): Payer: Self-pay | Admitting: Orthopedic Surgery

## 2020-06-18 ENCOUNTER — Other Ambulatory Visit: Payer: Self-pay

## 2020-06-22 ENCOUNTER — Encounter (HOSPITAL_BASED_OUTPATIENT_CLINIC_OR_DEPARTMENT_OTHER)
Admission: RE | Admit: 2020-06-22 | Discharge: 2020-06-22 | Disposition: A | Discharge status: Home / Self Care | Payer: BC Managed Care – PPO | Source: Ambulatory Visit | Attending: Orthopedic Surgery | Admitting: Orthopedic Surgery

## 2020-06-22 ENCOUNTER — Other Ambulatory Visit (HOSPITAL_COMMUNITY)
Admission: RE | Admit: 2020-06-22 | Discharge: 2020-06-22 | Disposition: A | Discharge status: Home / Self Care | Payer: BC Managed Care – PPO | Source: Ambulatory Visit | Attending: Orthopedic Surgery | Admitting: Orthopedic Surgery

## 2020-06-22 DIAGNOSIS — Z809 Family history of malignant neoplasm, unspecified: Secondary | ICD-10-CM | POA: Diagnosis not present

## 2020-06-22 DIAGNOSIS — Y9367 Activity, basketball: Secondary | ICD-10-CM | POA: Diagnosis not present

## 2020-06-22 DIAGNOSIS — E119 Type 2 diabetes mellitus without complications: Secondary | ICD-10-CM | POA: Diagnosis not present

## 2020-06-22 DIAGNOSIS — S86012A Strain of left Achilles tendon, initial encounter: Secondary | ICD-10-CM | POA: Diagnosis not present

## 2020-06-22 DIAGNOSIS — Z20822 Contact with and (suspected) exposure to covid-19: Secondary | ICD-10-CM | POA: Insufficient documentation

## 2020-06-22 DIAGNOSIS — Z01812 Encounter for preprocedural laboratory examination: Secondary | ICD-10-CM | POA: Insufficient documentation

## 2020-06-22 DIAGNOSIS — Z79899 Other long term (current) drug therapy: Secondary | ICD-10-CM | POA: Diagnosis not present

## 2020-06-22 LAB — BASIC METABOLIC PANEL
Anion gap: 7 (ref 5–15)
BUN: 18 mg/dL (ref 6–20)
CO2: 27 mmol/L (ref 22–32)
Calcium: 10.1 mg/dL (ref 8.9–10.3)
Chloride: 105 mmol/L (ref 98–111)
Creatinine, Ser: 1.37 mg/dL — ABNORMAL HIGH (ref 0.61–1.24)
GFR, Estimated: >60 mL/min (ref >60)
Glucose, Bld: 85 mg/dL (ref 70–99)
Potassium: 5.1 mmol/L (ref 3.5–5.1)
Sodium: 139 mmol/L (ref 135–145)

## 2020-06-22 LAB — SARS CORONAVIRUS 2 (TAT 6-24 HRS): SARS Coronavirus 2: NEGATIVE

## 2020-06-22 NOTE — Progress Notes (Signed)

## 2020-06-24 ENCOUNTER — Ambulatory Visit (HOSPITAL_BASED_OUTPATIENT_CLINIC_OR_DEPARTMENT_OTHER): Payer: BC Managed Care – PPO | Admitting: Anesthesiology

## 2020-06-24 ENCOUNTER — Encounter (HOSPITAL_BASED_OUTPATIENT_CLINIC_OR_DEPARTMENT_OTHER)
Admission: RE | Disposition: A | Discharge status: Home / Self Care | Payer: Self-pay | Source: Home / Self Care | Attending: Orthopedic Surgery

## 2020-06-24 ENCOUNTER — Ambulatory Visit (HOSPITAL_BASED_OUTPATIENT_CLINIC_OR_DEPARTMENT_OTHER)
Admission: RE | Admit: 2020-06-24 | Discharge: 2020-06-24 | Disposition: A | Discharge status: Home / Self Care | Payer: BC Managed Care – PPO | Attending: Orthopedic Surgery | Admitting: Orthopedic Surgery

## 2020-06-24 ENCOUNTER — Encounter (HOSPITAL_BASED_OUTPATIENT_CLINIC_OR_DEPARTMENT_OTHER): Payer: Self-pay | Admitting: Orthopedic Surgery

## 2020-06-24 ENCOUNTER — Other Ambulatory Visit: Payer: Self-pay

## 2020-06-24 DIAGNOSIS — S86012A Strain of left Achilles tendon, initial encounter: Secondary | ICD-10-CM | POA: Insufficient documentation

## 2020-06-24 DIAGNOSIS — Z79899 Other long term (current) drug therapy: Secondary | ICD-10-CM | POA: Insufficient documentation

## 2020-06-24 DIAGNOSIS — Y9367 Activity, basketball: Secondary | ICD-10-CM | POA: Insufficient documentation

## 2020-06-24 DIAGNOSIS — E119 Type 2 diabetes mellitus without complications: Secondary | ICD-10-CM | POA: Insufficient documentation

## 2020-06-24 DIAGNOSIS — Z809 Family history of malignant neoplasm, unspecified: Secondary | ICD-10-CM | POA: Insufficient documentation

## 2020-06-24 DIAGNOSIS — Z20822 Contact with and (suspected) exposure to covid-19: Secondary | ICD-10-CM | POA: Insufficient documentation

## 2020-06-24 HISTORY — DX: Type 2 diabetes mellitus without complications: E11.9

## 2020-06-24 HISTORY — PX: ACHILLES TENDON SURGERY: SHX542

## 2020-06-24 HISTORY — DX: Strain of right Achilles tendon, initial encounter: S86.011A

## 2020-06-24 SURGERY — REPAIR, TENDON, ACHILLES
Anesthesia: General | Site: Foot | Laterality: Left

## 2020-06-24 MED ORDER — PROMETHAZINE HCL 25 MG/ML IJ SOLN: 6.2500 mg | INTRAMUSCULAR | Status: DC | PRN

## 2020-06-24 MED ORDER — PROPOFOL 10 MG/ML IV BOLUS
INTRAVENOUS | Status: DC | PRN
  Administered 2020-06-24: 170 mg via INTRAVENOUS

## 2020-06-24 MED ORDER — SENNA 8.6 MG PO TABS: 2.0000 | ORAL_TABLET | Freq: Two times a day (BID) | ORAL | 0 refills | Status: AC

## 2020-06-24 MED ORDER — ONDANSETRON HCL 4 MG/2ML IJ SOLN
INTRAMUSCULAR | Status: AC
  Filled 2020-06-24: qty 2

## 2020-06-24 MED ORDER — HYDROMORPHONE HCL 1 MG/ML IJ SOLN
0.2500 mg | INTRAMUSCULAR | Status: DC | PRN
Start: 2020-06-24 — End: 2020-06-24

## 2020-06-24 MED ORDER — ONDANSETRON HCL 4 MG/2ML IJ SOLN
INTRAMUSCULAR | Status: DC | PRN
  Administered 2020-06-24: 4 mg via INTRAVENOUS

## 2020-06-24 MED ORDER — MEPERIDINE HCL 25 MG/ML IJ SOLN
6.2500 mg | INTRAMUSCULAR | Status: DC | PRN
Start: 2020-06-24 — End: 2020-06-24

## 2020-06-24 MED ORDER — PROPOFOL 10 MG/ML IV BOLUS
INTRAVENOUS | Status: AC
  Filled 2020-06-24: qty 20

## 2020-06-24 MED ORDER — FENTANYL CITRATE (PF) 100 MCG/2ML IJ SOLN
100.0000 ug | Freq: Once | INTRAMUSCULAR | Status: AC
  Administered 2020-06-24: 100 ug via INTRAVENOUS

## 2020-06-24 MED ORDER — ROPIVACAINE HCL 5 MG/ML IJ SOLN
INTRAMUSCULAR | Status: DC | PRN
  Administered 2020-06-24: 30 mL via PERINEURAL

## 2020-06-24 MED ORDER — OXYCODONE HCL 5 MG PO TABS
5.0000 mg | ORAL_TABLET | Freq: Once | ORAL | Status: AC | PRN
  Administered 2020-06-24: 5 mg via ORAL

## 2020-06-24 MED ORDER — ASPIRIN EC 81 MG PO TBEC: 81.0000 mg | DELAYED_RELEASE_TABLET | Freq: Two times a day (BID) | ORAL | 0 refills | Status: AC

## 2020-06-24 MED ORDER — LIDOCAINE HCL (CARDIAC) PF 100 MG/5ML IV SOSY
PREFILLED_SYRINGE | INTRAVENOUS | Status: DC | PRN
  Administered 2020-06-24: 80 mg via INTRAVENOUS

## 2020-06-24 MED ORDER — CEFAZOLIN SODIUM-DEXTROSE 2-4 GM/100ML-% IV SOLN
2.0000 g | INTRAVENOUS | Status: AC
  Administered 2020-06-24: 2 g via INTRAVENOUS

## 2020-06-24 MED ORDER — OXYCODONE HCL 5 MG PO TABS: 5.0000 mg | ORAL_TABLET | ORAL | 0 refills | Status: AC | PRN

## 2020-06-24 MED ORDER — DOCUSATE SODIUM 100 MG PO CAPS: 100.0000 mg | ORAL_CAPSULE | Freq: Two times a day (BID) | ORAL | 0 refills | Status: AC

## 2020-06-24 MED ORDER — FENTANYL CITRATE (PF) 100 MCG/2ML IJ SOLN
INTRAMUSCULAR | Status: AC
  Filled 2020-06-24: qty 2

## 2020-06-24 MED ORDER — FENTANYL CITRATE (PF) 100 MCG/2ML IJ SOLN
INTRAMUSCULAR | Status: DC | PRN
  Administered 2020-06-24: 50 ug via INTRAVENOUS

## 2020-06-24 MED ORDER — MIDAZOLAM HCL 2 MG/2ML IJ SOLN
INTRAMUSCULAR | Status: AC
  Filled 2020-06-24: qty 2

## 2020-06-24 MED ORDER — ROCURONIUM BROMIDE 10 MG/ML (PF) SYRINGE
PREFILLED_SYRINGE | INTRAVENOUS | Status: AC
  Filled 2020-06-24: qty 10

## 2020-06-24 MED ORDER — VANCOMYCIN HCL 500 MG IV SOLR
INTRAVENOUS | Status: DC | PRN
  Administered 2020-06-24: 500 mg via TOPICAL

## 2020-06-24 MED ORDER — ROCURONIUM BROMIDE 100 MG/10ML IV SOLN
INTRAVENOUS | Status: DC | PRN
  Administered 2020-06-24: 80 mg via INTRAVENOUS

## 2020-06-24 MED ORDER — OXYCODONE HCL 5 MG PO TABS
ORAL_TABLET | ORAL | Status: AC
  Filled 2020-06-24: qty 1

## 2020-06-24 MED ORDER — LACTATED RINGERS IV SOLN: INTRAVENOUS | Status: DC

## 2020-06-24 MED ORDER — 0.9 % SODIUM CHLORIDE (POUR BTL) OPTIME
TOPICAL | Status: DC | PRN
  Administered 2020-06-24: 120 mL

## 2020-06-24 MED ORDER — SODIUM CHLORIDE 0.9 % IV SOLN: INTRAVENOUS | Status: DC

## 2020-06-24 MED ORDER — VANCOMYCIN HCL 500 MG IV SOLR
INTRAVENOUS | Status: AC
  Filled 2020-06-24: qty 500

## 2020-06-24 MED ORDER — SUGAMMADEX SODIUM 200 MG/2ML IV SOLN
INTRAVENOUS | Status: DC | PRN
  Administered 2020-06-24: 200 mg via INTRAVENOUS

## 2020-06-24 MED ORDER — AMISULPRIDE (ANTIEMETIC) 5 MG/2ML IV SOLN: 10.0000 mg | Freq: Once | INTRAVENOUS | Status: DC | PRN

## 2020-06-24 MED ORDER — MIDAZOLAM HCL 2 MG/2ML IJ SOLN
2.0000 mg | Freq: Once | INTRAMUSCULAR | Status: AC
  Administered 2020-06-24: 2 mg via INTRAVENOUS

## 2020-06-24 MED ORDER — OXYCODONE HCL 5 MG/5ML PO SOLN: 5.0000 mg | Freq: Once | ORAL | Status: AC | PRN

## 2020-06-24 MED ORDER — CEFAZOLIN SODIUM-DEXTROSE 2-4 GM/100ML-% IV SOLN
INTRAVENOUS | Status: AC
  Filled 2020-06-24: qty 100

## 2020-06-24 SURGICAL SUPPLY — 76 items
BANDAGE ESMARK 6X9 LF (GAUZE/BANDAGES/DRESSINGS) ×1 IMPLANT
BLADE AVERAGE 25X9 (BLADE) IMPLANT
BLADE MICRO SAGITTAL (BLADE) IMPLANT
BLADE SURG 15 STRL LF DISP TIS (BLADE) ×2 IMPLANT
BLADE SURG 15 STRL SS (BLADE) ×2
BNDG COHESIVE 4X5 TAN STRL (GAUZE/BANDAGES/DRESSINGS) ×2 IMPLANT
BNDG COHESIVE 6X5 TAN STRL LF (GAUZE/BANDAGES/DRESSINGS) ×2 IMPLANT
BNDG ESMARK 6X9 LF (GAUZE/BANDAGES/DRESSINGS) ×2
BOOT STEPPER DURA LG (SOFTGOODS) IMPLANT
BOOT STEPPER DURA MED (SOFTGOODS) IMPLANT
CANISTER SUCT 1200ML W/VALVE (MISCELLANEOUS) ×2 IMPLANT
CHLORAPREP W/TINT 26 (MISCELLANEOUS) ×2 IMPLANT
COVER BACK TABLE 60X90IN (DRAPES) ×2 IMPLANT
COVER WAND RF STERILE (DRAPES) IMPLANT
CUFF TOURN SGL QUICK 34 (TOURNIQUET CUFF) ×1
CUFF TRNQT CYL 34X4.125X (TOURNIQUET CUFF) ×1 IMPLANT
DRAPE EXTREMITY T 121X128X90 (DISPOSABLE) ×2 IMPLANT
DRAPE OEC MINIVIEW 54X84 (DRAPES) IMPLANT
DRAPE U-SHAPE 47X51 STRL (DRAPES) ×2 IMPLANT
DRSG MEPITEL 4X7.2 (GAUZE/BANDAGES/DRESSINGS) ×2 IMPLANT
DRSG PAD ABDOMINAL 8X10 ST (GAUZE/BANDAGES/DRESSINGS) ×4 IMPLANT
ELECT REM PT RETURN 9FT ADLT (ELECTROSURGICAL) ×2
ELECTRODE REM PT RTRN 9FT ADLT (ELECTROSURGICAL) ×1 IMPLANT
GAUZE SPONGE 4X4 12PLY STRL (GAUZE/BANDAGES/DRESSINGS) ×2 IMPLANT
GLOVE SRG 8 PF TXTR STRL LF DI (GLOVE) ×2 IMPLANT
GLOVE SURG ENC MOIS LTX SZ7 (GLOVE) ×2 IMPLANT
GLOVE SURG ENC MOIS LTX SZ8 (GLOVE) ×2 IMPLANT
GLOVE SURG LTX SZ8 (GLOVE) ×2 IMPLANT
GLOVE SURG POLYISO LF SZ7 (GLOVE) ×2 IMPLANT
GLOVE SURG UNDER POLY LF SZ6.5 (GLOVE) ×2 IMPLANT
GLOVE SURG UNDER POLY LF SZ7 (GLOVE) ×4 IMPLANT
GLOVE SURG UNDER POLY LF SZ8 (GLOVE) ×2
GOWN STRL REUS W/ TWL LRG LVL3 (GOWN DISPOSABLE) ×2 IMPLANT
GOWN STRL REUS W/ TWL XL LVL3 (GOWN DISPOSABLE) ×2 IMPLANT
GOWN STRL REUS W/TWL LRG LVL3 (GOWN DISPOSABLE) ×2
GOWN STRL REUS W/TWL XL LVL3 (GOWN DISPOSABLE) ×2
NDL SAFETY ECLIPSE 18X1.5 (NEEDLE) IMPLANT
NDL SUT 6 .5 CRC .975X.05 MAYO (NEEDLE) IMPLANT
NEEDLE HYPO 18GX1.5 SHARP (NEEDLE)
NEEDLE HYPO 22GX1.5 SAFETY (NEEDLE) IMPLANT
NEEDLE MAYO TAPER (NEEDLE)
NS IRRIG 1000ML POUR BTL (IV SOLUTION) ×2 IMPLANT
PACK BASIN DAY SURGERY FS (CUSTOM PROCEDURE TRAY) ×2 IMPLANT
PAD CAST 4YDX4 CTTN HI CHSV (CAST SUPPLIES) ×1 IMPLANT
PADDING CAST COTTON 4X4 STRL (CAST SUPPLIES) ×1
PADDING CAST COTTON 6X4 STRL (CAST SUPPLIES) ×2 IMPLANT
PENCIL SMOKE EVACUATOR (MISCELLANEOUS) ×2 IMPLANT
SANITIZER HAND PURELL 535ML FO (MISCELLANEOUS) ×2 IMPLANT
SHEET MEDIUM DRAPE 40X70 STRL (DRAPES) ×2 IMPLANT
SLEEVE SCD COMPRESS KNEE MED (STOCKING) ×2 IMPLANT
SPLINT FAST PLASTER 5X30 (CAST SUPPLIES) ×20
SPLINT PLASTER CAST FAST 5X30 (CAST SUPPLIES) ×20 IMPLANT
SPONGE LAP 18X18 RF (DISPOSABLE) ×2 IMPLANT
STAPLER VISISTAT 35W (STAPLE) IMPLANT
STOCKINETTE 6  STRL (DRAPES) ×1
STOCKINETTE 6 STRL (DRAPES) ×1 IMPLANT
SUCTION FRAZIER HANDLE 10FR (MISCELLANEOUS) ×1
SUCTION TUBE FRAZIER 10FR DISP (MISCELLANEOUS) ×1 IMPLANT
SUT ETHIBOND 2 OS 4 DA (SUTURE) IMPLANT
SUT ETHIBOND 3-0 V-5 (SUTURE) IMPLANT
SUT ETHILON 3 0 PS 1 (SUTURE) ×2 IMPLANT
SUT FIBERWIRE #2 38 T-5 BLUE (SUTURE)
SUT MNCRL AB 3-0 PS2 18 (SUTURE) ×2 IMPLANT
SUT VIC AB 0 CT1 27 (SUTURE)
SUT VIC AB 0 CT1 27XBRD ANBCTR (SUTURE) IMPLANT
SUT VIC AB 0 SH 27 (SUTURE) ×2 IMPLANT
SUT VIC AB 1 CT1 27 (SUTURE) ×4
SUT VIC AB 1 CT1 27XBRD ANBCTR (SUTURE) ×4 IMPLANT
SUT VIC AB 2-0 SH 18 (SUTURE) IMPLANT
SUT VIC AB 2-0 SH 27 (SUTURE) ×1
SUT VIC AB 2-0 SH 27XBRD (SUTURE) ×1 IMPLANT
SUTURE FIBERWR #2 38 T-5 BLUE (SUTURE) IMPLANT
SYR BULB EAR ULCER 3OZ GRN STR (SYRINGE) ×2 IMPLANT
TOWEL GREEN STERILE FF (TOWEL DISPOSABLE) ×4 IMPLANT
TUBE CONNECTING 20X1/4 (TUBING) ×2 IMPLANT
UNDERPAD 30X36 HEAVY ABSORB (UNDERPADS AND DIAPERS) ×2 IMPLANT

## 2020-06-24 NOTE — Op Note (Signed)
06/24/2020  10:28 AM  PATIENT:  Derek Hopkins  50 y.o. male  PRE-OPERATIVE DIAGNOSIS:  Left Achilles tendon rupture  POST-OPERATIVE DIAGNOSIS:  Left Achilles tendon rupture  Procedure(s):  Left ACHILLES TENDON REPAIR  SURGEON:  Toni Arthurs, MD  ASSISTANT: Alfredo Martinez, PA-C  ANESTHESIA:   General, regional  EBL:  minimal   TOURNIQUET:   Total Tourniquet Time Documented: Thigh (Left) - 49 minutes Total: Thigh (Left) - 49 minutes  COMPLICATIONS:  None apparent  DISPOSITION:  Extubated, awake and stable to recovery.  INDICATION FOR PROCEDURE: The patient is a 50 year old male with a past medical history significant for diabetes.  He ruptured his left Achilles tendon over a week ago playing basketball.  He presents now for surgical repair of the ruptured left Achilles tendon.  The risks and benefits of the alternative treatment options have been discussed in detail.  The patient wishes to proceed with surgery and specifically understands risks of bleeding, infection, nerve damage, blood clots, need for additional surgery, amputation and death.  PROCEDURE IN DETAIL: After preoperative consent was obtained and the correct operative site was identified, patient was brought to the operating room supine on a stretcher.  General anesthesia was induced.  Preoperative antibiotics were administered.  The left lower extremity was exsanguinated and a thigh tourniquet inflated to 250 mmHg.  The patient was then turned in the prone position on the operating table.  A surgical timeout was taken.  The left lower extremity was prepped and draped in standard sterile fashion.  A longitudinal incision was then made over the palpable defect in the tendon.  Dissection was carried sharply down through the skin and subcutaneous tissues and peritenon.  The Achilles tendon rupture was identified.  It was cleaned of all hematoma and irrigated copiously.  The deep fascia was incised over the FHL and released proximally  and distally to allow approximation of the peritenon at the end of the case.  #1  Vicryl sutures were placed in the tendon ends using Bunnell sutures.  This left for strands of suture protruding from each tendon stump.  The ankle was then maximally plantarflexed.  Sutures were then tied in pair wise fashion across the repair site.  The ankle could be dorsiflexed nearly to neutral with no gapping at the tendon site.  A 3-0 Monocryl running Silfverskiold stitch was placed circumferentially around the tendon repair.  The wound was again irrigated copiously and sprinkled with vancomycin powder.  The peritenon was repaired with inverted simple sutures of 2-0 Vicryl.  Skin incision was closed with horizontal mattress sutures of 3-0 nylon.  Sterile dressings were applied followed by a well-padded short leg splint with the ankle in gravity equinus.  Tourniquet was released after application of the dressings.  The patient was awakened from anesthesia and transported to the recovery room in stable condition.   FOLLOW UP PLAN: Nonweightbearing on the left lower extremity.  Follow-up in the office in 2 weeks for suture removal and conversion to a cam boot with 2 heel lifts.  Aspirin for DVT prophylaxis.      Alfredo Martinez PA-C was present and scrubbed for the duration of the operative case. His assistance was essential in positioning the patient, prepping and draping, gaining and maintaining exposure, performing the operation, closing and dressing the wounds and applying the splint.

## 2020-06-24 NOTE — H&P (Signed)
Derek Hopkins is an 50 y.o. male.   Chief Complaint:  Left ankle injury HPI: The patient is a 50 year old male with a past medical history significant for diabetes.  He injured his left ankle playing basketball over a week ago.  He has an Achilles tendon rupture.  We have discussed the risks and benefits of the alternative treatment options in detail.  He elects surgical treatment.  He understands specifically risks of bleeding, infection, nerve damage, blood clots, need for additional surgery, rerupture, amputation and death.  Past Medical History:  Diagnosis Date  . Diabetes mellitus without complication (HCC)    stopped taking meds on his own, has not seen PCP since 2019  . Ruptured, tendon, Achilles, right, initial encounter     Past Surgical History:  Procedure Laterality Date  . NO PAST SURGERIES    . None      Family History  Problem Relation Age of Onset  . Cancer Mother   . Cancer Father    Social History:  reports that he has never smoked. He has never used smokeless tobacco. He reports current alcohol use. He reports that he does not use drugs.  Allergies: No Known Allergies  Medications Prior to Admission  Medication Sig Dispense Refill  . Creatine POWD Take by mouth.    Marland Kitchen ibuprofen (ADVIL) 200 MG tablet Take 200 mg by mouth every 6 (six) hours as needed.      Results for orders placed or performed during the hospital encounter of 06/24/20 (from the past 48 hour(s))  Basic metabolic panel per protocol     Status: Abnormal   Collection Time: 06/22/20 12:30 PM  Result Value Ref Range   Sodium 139 135 - 145 mmol/L   Potassium 5.1 3.5 - 5.1 mmol/L   Chloride 105 98 - 111 mmol/L   CO2 27 22 - 32 mmol/L   Glucose, Bld 85 70 - 99 mg/dL    Comment: Glucose reference range applies only to samples taken after fasting for at least 8 hours.   BUN 18 6 - 20 mg/dL   Creatinine, Ser 8.65 (H) 0.61 - 1.24 mg/dL   Calcium 78.4 8.9 - 69.6 mg/dL   GFR, Estimated >29 >52 mL/min     Comment: (NOTE) Calculated using the CKD-EPI Creatinine Equation (2021)    Anion gap 7 5 - 15    Comment: Performed at [facility], [address], [place], [place] [address]   No results found.  Review of Systems no recent fever, chills, nausea, vomiting or changes in his appetite  Blood pressure 115/61, pulse (!) 57, temperature 99 F (37.2 C), temperature source Oral, resp. rate 15, height 5\' 3"  (1.6 m), weight 76.7 kg, SpO2 97 %. Physical Exam  Well-nourished well-developed man in no apparent distress.  Alert and oriented x4.  Normal mood and affect.  Gait is antalgic to the left in a cam boot.  Left ankle has healthy skin.  2+ dorsalis pedis and posterior tibial pulses.  Intact sensibility to light touch dorsally and plantarly at the forefoot.  4-5 strength in plantarflexion.  Tender to palpation at the Achilles at the proximal tendinous portion.   Assessment/Plan Left achilles tendon rupture - to the OR today for left achilles tendon repair.  The risks and benefits of the alternative treatment options have been discussed in detail.  The patient wishes to proceed with surgery and specifically understands risks of bleeding, infection, nerve damage, blood clots, need for additional surgery, amputation and  death.   Toni Arthurs, MD 06/24/2020, 8:48 AM

## 2020-06-24 NOTE — Discharge Instructions (Addendum)
Regional Anesthesia Blocks  1. Numbness or the inability to move the "blocked" extremity may last from 3-48 hours after placement. The length of time depends on the medication injected and your individual response to the medication. If the numbness is not going away after 48 hours, call your surgeon.  2. The extremity that is blocked will need to be protected until the numbness is gone and the  Strength has returned. Because you cannot feel it, you will need to take extra care to avoid injury. Because it may be weak, you may have difficulty moving it or using it. You may not know what position it is in without looking at it while the block is in effect.  3. For blocks in the legs and feet, returning to weight bearing and walking needs to be done carefully. You will need to wait until the numbness is entirely gone and the strength has returned. You should be able to move your leg and foot normally before you try and bear weight or walk. You will need someone to be with you when you first try to ensure you do not fall and possibly risk injury.  4. Bruising and tenderness at the needle site are common side effects and will resolve in a few days.  5. Persistent numbness or new problems with movement should be communicated to the surgeon or the [facility] [phone] [facility] [phone]).    Post Anesthesia Home Care Instructions  Activity: Get plenty of rest for the remainder of the day. A responsible individual must stay with you for 24 hours following the procedure.  For the next 24 hours, DO NOT: -Drive a car -Advertising copywriter -Drink alcoholic beverages -Take any medication unless instructed by your physician -Make any legal decisions or sign important papers.  Meals: Start with liquid foods such as gelatin or soup. Progress to regular foods as tolerated. Avoid greasy, spicy, heavy foods. If nausea and/or vomiting occur, drink only clear liquids until  the nausea and/or vomiting subsides. Call your physician if vomiting continues.  Special Instructions/Symptoms: Your throat may feel dry or sore from the anesthesia or the breathing tube placed in your throat during surgery. If this causes discomfort, gargle with warm salt water. The discomfort should disappear within 24 hours.  If you had a scopolamine patch placed behind your ear for the management of post- operative nausea and/or vomiting:  1. The medication in the patch is effective for 72 hours, after which it should be removed.  Wrap patch in a tissue and discard in the trash. Wash hands thoroughly with soap and water. 2. You may remove the patch earlier than 72 hours if you experience unpleasant side effects which may include dry mouth, dizziness or visual disturbances. 3. Avoid touching the patch. Wash your hands with soap and water after contact with the patch.         Toni Arthurs, MD [facility]  Please read the following information regarding your care after surgery.  Medications  You only need a prescription for the narcotic pain medicine (ex. oxycodone, Percocet, Norco).  All of the other medicines listed below are available over the counter. X Ibuprofen that you have at home, as prescribed, for the first 3 days after surgery. X acetominophen (Tylenol) 650 mg every 4-6 hours as you need for minor to moderate pain X oxycodone as prescribed for severe pain  Narcotic pain medicine (ex. oxycodone, Percocet, Vicodin) will cause constipation.  To prevent this problem, take  the following medicines while you are taking any pain medicine. X docusate sodium (Colace) 100 mg twice a day X senna (Senokot) 2 tablets twice a day  X To help prevent blood clots, take a baby aspirin (81 mg) twice a day for two weeks after surgery.  You should also get up every hour while you are awake to move around.    Weight Bearing X Do not bear any weight on the operated leg or foot.  Cast / Splint  / Dressing X Keep your splint, cast or dressing clean and dry.  Dont put anything (coat hanger, pencil, etc) down inside of it.  If it gets damp, use a hair dryer on the cool setting to dry it.  If it gets soaked, call the office to schedule an appointment for a cast change.   After your dressing, cast or splint is removed; you may shower, but do not soak or scrub the wound.  Allow the water to run over it, and then gently pat it dry.  Swelling It is normal for you to have swelling where you had surgery.  To reduce swelling and pain, keep your toes above your nose for at least 3 days after surgery.  It may be necessary to keep your foot or leg elevated for several weeks.  If it hurts, it should be elevated.  Follow Up Call my office at [phone] when you are discharged from the hospital or surgery center to schedule an appointment to be seen two weeks after surgery.  Call my office at [phone] if you develop a fever >101.5 F, nausea, vomiting, bleeding from the surgical site or severe pain.

## 2020-06-24 NOTE — Transfer of Care (Signed)
Immediate Anesthesia Transfer of Care Note  Patient: Derek Hopkins  Procedure(s) Performed: ACHILLES TENDON REPAIR (Left Foot)  Patient Location: PACU  Anesthesia Type:General  Level of Consciousness: drowsy, patient cooperative and responds to stimulation  Airway & Oxygen Therapy: Patient Spontanous Breathing and Patient connected to face mask oxygen  Post-op Assessment: Report given to RN and Post -op Vital signs reviewed and stable  Post vital signs: Reviewed and stable  Last Vitals:  Vitals Value Taken Time  BP    Temp    Pulse 62 06/24/20 1030  Resp 24 06/24/20 1030  SpO2 96 % 06/24/20 1030  Vitals shown include unvalidated device data.  Last Pain:  Vitals:   06/24/20 0743  TempSrc: Oral  PainSc: 0-No pain      Patients Stated Pain Goal: 0 (06/24/20 0743)  Complications: No complications documented.

## 2020-06-24 NOTE — Anesthesia Preprocedure Evaluation (Addendum)
Anesthesia Evaluation  Patient identified by MRN, date of birth, ID band Patient awake    Reviewed: Allergy & Precautions, NPO status , Patient's Chart, lab work & pertinent test results  Airway Mallampati: II  TM Distance: >3 FB Neck ROM: Full    Dental no notable dental hx.    Pulmonary neg pulmonary ROS,    Pulmonary exam normal breath sounds clear to auscultation       Cardiovascular negative cardio ROS Normal cardiovascular exam Rhythm:Regular Rate:Normal     Neuro/Psych negative neurological ROS  negative psych ROS   GI/Hepatic negative GI ROS, Neg liver ROS,   Endo/Other  diabetes  Renal/GU Renal InsufficiencyRenal disease  negative genitourinary   Musculoskeletal negative musculoskeletal ROS (+)   Abdominal   Peds negative pediatric ROS (+)  Hematology negative hematology ROS (+)   Anesthesia Other Findings   Reproductive/Obstetrics negative OB ROS                          Anesthesia Physical Anesthesia Plan  ASA: II  Anesthesia Plan: General   Post-op Pain Management:  Regional for Post-op pain   Induction: Intravenous  PONV Risk Score and Plan: 2 and Ondansetron, Midazolam and Treatment may vary due to age or medical condition  Airway Management Planned: Oral ETT  Additional Equipment:   Intra-op Plan:   Post-operative Plan: Extubation in OR  Informed Consent: I have reviewed the patients History and Physical, chart, labs and discussed the procedure including the risks, benefits and alternatives for the proposed anesthesia with the patient or authorized representative who has indicated his/her understanding and acceptance.     Dental advisory given  Plan Discussed with: CRNA  Anesthesia Plan Comments:         Anesthesia Quick Evaluation

## 2020-06-24 NOTE — Anesthesia Procedure Notes (Signed)
Procedure Name: Intubation Date/Time: 06/24/2020 9:21 AM Performed by: Thornell Mule, CRNA Pre-anesthesia Checklist: Patient identified, Emergency Drugs available, Suction available and Patient being monitored Patient Re-evaluated:Patient Re-evaluated prior to induction Oxygen Delivery Method: Circle system utilized Preoxygenation: Pre-oxygenation with 100% oxygen Induction Type: IV induction Ventilation: Mask ventilation without difficulty Laryngoscope Size: Miller and 3 Grade View: Grade II Tube type: Oral Tube size: 7.5 mm Number of attempts: 1 Airway Equipment and Method: Stylet and Oral airway Placement Confirmation: ETT inserted through vocal cords under direct vision,  positive ETCO2 and breath sounds checked- equal and bilateral Secured at: 21 cm Tube secured with: Tape Dental Injury: Teeth and Oropharynx as per pre-operative assessment

## 2020-06-24 NOTE — Progress Notes (Signed)
Assisted Dr. Miller with left, ultrasound guided, popliteal block. Side rails up, monitors on throughout procedure. See vital signs in flow sheet. Tolerated Procedure well. 

## 2020-06-24 NOTE — Anesthesia Procedure Notes (Signed)
Anesthesia Regional Block: Popliteal block   Pre-Anesthetic Checklist: ,, timeout performed, Correct Patient, Correct Site, Correct Laterality, Correct Procedure, Correct Position, site marked, Risks and benefits discussed,  Surgical consent,  Pre-op evaluation,  At surgeon's request and post-op pain management  Laterality: Left  Prep: chloraprep       Needles:  Injection technique: Single-shot  Needle Type: Stimiplex     Needle Length: 9cm  Needle Gauge: 21     Additional Needles:   Procedures:,,,, ultrasound used (permanent image in chart),,,,  Narrative:  Start time: 06/24/2020 8:31 AM End time: 06/24/2020 8:36 AM Injection made incrementally with aspirations every 5 mL.  Performed by: Personally  Anesthesiologist: Lowella Curb, MD

## 2020-06-25 ENCOUNTER — Encounter (HOSPITAL_BASED_OUTPATIENT_CLINIC_OR_DEPARTMENT_OTHER): Payer: Self-pay | Admitting: Orthopedic Surgery

## 2020-06-25 NOTE — Anesthesia Postprocedure Evaluation (Signed)
Anesthesia Post Note  Patient: Derek Hopkins  Procedure(s) Performed: ACHILLES TENDON REPAIR (Left Foot)     Patient location during evaluation: PACU Anesthesia Type: General Level of consciousness: awake and alert Pain management: pain level controlled Vital Signs Assessment: post-procedure vital signs reviewed and stable Respiratory status: spontaneous breathing, nonlabored ventilation and respiratory function stable Cardiovascular status: blood pressure returned to baseline and stable Postop Assessment: no apparent nausea or vomiting Anesthetic complications: no   No complications documented.  Last Vitals:  Vitals:   06/24/20 1100 06/24/20 1152  BP: (!) 152/83 (!) 150/51  Pulse: 64 61  Resp: 12 14  Temp:  36.8 C  SpO2: 98% 97%    Last Pain:  Vitals:   06/24/20 1152  TempSrc:   PainSc: 3    Pain Goal: Patients Stated Pain Goal: 0 (06/24/20 0743)                 Lowella Curb

## 2022-02-06 ENCOUNTER — Other Ambulatory Visit: Payer: Self-pay

## 2022-02-06 ENCOUNTER — Ambulatory Visit (HOSPITAL_COMMUNITY)
Admission: EM | Admit: 2022-02-06 | Discharge: 2022-02-06 | Disposition: A | Discharge status: Home / Self Care | Payer: BC Managed Care – PPO | Attending: Family Medicine | Admitting: Family Medicine

## 2022-02-06 ENCOUNTER — Encounter (HOSPITAL_COMMUNITY): Payer: Self-pay | Admitting: *Deleted

## 2022-02-06 DIAGNOSIS — J069 Acute upper respiratory infection, unspecified: Secondary | ICD-10-CM | POA: Diagnosis not present

## 2022-02-06 DIAGNOSIS — Z79899 Other long term (current) drug therapy: Secondary | ICD-10-CM | POA: Insufficient documentation

## 2022-02-06 DIAGNOSIS — Z1152 Encounter for screening for COVID-19: Secondary | ICD-10-CM | POA: Diagnosis not present

## 2022-02-06 LAB — RESP PANEL BY RT-PCR (FLU A&B, COVID) ARPGX2
Influenza A by PCR: NEGATIVE
Influenza B by PCR: NEGATIVE
SARS Coronavirus 2 by RT PCR: NEGATIVE

## 2022-02-06 MED ORDER — BENZONATATE 100 MG PO CAPS: 100.0000 mg | ORAL_CAPSULE | Freq: Three times a day (TID) | ORAL | 0 refills | Status: AC | PRN

## 2022-02-06 NOTE — ED Triage Notes (Signed)
Pt reports for past 3 days he has had a cough ,sore throat and general body aches.

## 2022-02-06 NOTE — Discharge Instructions (Addendum)
  You have been swabbed for COVID and flu, and the test will result in the next 24 hours. Our staff will call you if positive. If the COVID test is positive, you should quarantine for 5 days from the start of your symptoms  Take benzonatate 100 mg, 1 tab every 8 hours as needed for cough.  Make sure you are drinking sufficient fluids

## 2022-02-06 NOTE — ED Provider Notes (Addendum)
[place]    CSN: [id number] Arrival date & time: 02/06/22  0805      History   Chief Complaint Chief Complaint  Patient presents with   Cough   Sore Throat   Generalized Body Aches    HPI Derek Hopkins is a 51 y.o. male.    Cough Sore Throat   Here with congestion that began the evening of October 27.  Now he has more congestion in his throat than in his nose.  He did have some sore throat but that also is improved some.  He was coughing last night and today he feels tired and is achy.  He had not documented any fevers at home, but his temperature here is 99.8.  No vomiting or diarrhea, but he did have urinary frequency overnight  Last EGFR was normal at over 60  Past Medical History:  Diagnosis Date   Diabetes mellitus without complication ([place])    stopped taking meds on his own, has not seen PCP since 2019   Ruptured, tendon, Achilles, right, initial encounter     There are no problems to display for this patient.   Past Surgical History:  Procedure Laterality Date   ACHILLES TENDON SURGERY Left 06/24/2020   Procedure: ACHILLES TENDON REPAIR;  Surgeon: Wylene Simmer, MD;  Location: [facility];  Service: Orthopedics;  Laterality: Left;   NO PAST SURGERIES     None         Home Medications    Prior to Admission medications   Medication Sig Start Date End Date Taking? Authorizing Provider  benzonatate (TESSALON) 100 MG capsule Take 1 capsule (100 mg total) by mouth 3 (three) times daily as needed for cough. 02/06/22  Yes Barrett Henle, MD  aspirin EC 81 MG tablet Take 1 tablet (81 mg total) by mouth 2 (two) times daily. 06/24/20   Corky Sing, PA-C  Creatine POWD Take by mouth.    [provider]  docusate sodium (COLACE) 100 MG capsule Take 1 capsule (100 mg total) by mouth 2 (two) times daily. While taking narcotic pain medicine. 06/24/20   Corky Sing, PA-C  ibuprofen (ADVIL) 200 MG tablet Take 200 mg  by mouth every 6 (six) hours as needed.    [provider]  senna (SENOKOT) 8.6 MG TABS tablet Take 2 tablets (17.2 mg total) by mouth 2 (two) times daily. 06/24/20   Corky Sing, PA-C    Family History Family History  Problem Relation Age of Onset   Cancer Mother    Cancer Father     Social History Social History   Tobacco Use   Smoking status: Never   Smokeless tobacco: Never  Substance Use Topics   Alcohol use: Yes    Alcohol/week: 0.0 standard drinks of alcohol    Comment: occ   Drug use: No    Comment: Quit 2002     Allergies   Patient has no known allergies.   Review of Systems Review of Systems  Respiratory:  Positive for cough.      Physical Exam Triage Vital Signs ED Triage Vitals  Enc Vitals Group     BP 02/06/22 0837 (!) 142/72     Pulse Rate 02/06/22 0837 90     Resp 02/06/22 0837 18     Temp 02/06/22 0837 99.8 F (37.7 C)     Temp src --      SpO2 02/06/22 0837 94 %  Weight --      Height --      Head Circumference --      Peak Flow --      Pain Score 02/06/22 0835 6     Pain Loc --      Pain Edu? --      Excl. in [place]? --    No data found.  Updated Vital Signs BP (!) 142/72   Pulse 90   Temp 99.8 F (37.7 C)   Resp 18   SpO2 94%   Visual Acuity Right Eye Distance:   Left Eye Distance:   Bilateral Distance:    Right Eye Near:   Left Eye Near:    Bilateral Near:     Physical Exam Vitals reviewed.  Constitutional:      General: He is not in acute distress.    Appearance: He is not toxic-appearing.  HENT:     Right Ear: Tympanic membrane and ear canal normal.     Left Ear: Tympanic membrane and ear canal normal.     Nose: Nose normal.     Mouth/Throat:     Mouth: Mucous membranes are moist.     Comments: Clear mucus draining in the oropharynx Eyes:     Extraocular Movements: Extraocular movements intact.     Conjunctiva/sclera: Conjunctivae normal.     Pupils: Pupils are equal, round, and reactive to  light.  Cardiovascular:     Rate and Rhythm: Normal rate and regular rhythm.     Heart sounds: No murmur heard. Pulmonary:     Effort: Pulmonary effort is normal. No respiratory distress.     Breath sounds: No stridor. No wheezing, rhonchi or rales.  Musculoskeletal:     Cervical back: Neck supple.  Lymphadenopathy:     Cervical: No cervical adenopathy.  Skin:    Capillary Refill: Capillary refill takes less than 2 seconds.     Coloration: Skin is not jaundiced or pale.  Neurological:     General: No focal deficit present.     Mental Status: He is alert and oriented to person, place, and time.  Psychiatric:        Behavior: Behavior normal.      UC Treatments / Results  Labs (all labs ordered are listed, but only abnormal results are displayed) Labs Reviewed  RESP PANEL BY RT-PCR (FLU A&B, COVID) [id number]    EKG   Radiology No results found.  Procedures Procedures (including critical care time)  Medications Ordered in UC Medications - No data to display  Initial Impression / Assessment and Plan / UC Course  I have reviewed the triage vital signs and the nursing notes.  Pertinent labs & imaging results that were available during my care of the patient were reviewed by me and considered in my medical decision making (see chart for details).        COVID and flu swab are done today  If he is positive for COVID, he is a candidate for Paxlovid.  His last EGFR was normal in epic.  If his flu results today this would be within the 72 hours, and he would be a candidate for Tamiflu. Final Clinical Impressions(s) / UC Diagnoses   Final diagnoses:  Viral upper respiratory tract infection     Discharge Instructions       You have been swabbed for COVID and flu, and the test will result in the next 24 hours. Our staff will call you if positive. If the COVID  test is positive, you should quarantine for 5 days from the start of your symptoms  Take benzonatate 100  mg, 1 tab every 8 hours as needed for cough.  Make sure you are drinking sufficient fluids      ED Prescriptions     Medication Sig Dispense Auth. Provider   benzonatate (TESSALON) 100 MG capsule Take 1 capsule (100 mg total) by mouth 3 (three) times daily as needed for cough. 21 capsule Barrett Henle, MD      PDMP not reviewed this encounter.   Barrett Henle, MD 02/06/22 6579    Barrett Henle, MD 02/06/22 [phone]
